# Patient Record
Sex: Female | Born: 1980 | Race: White | Hispanic: No | Marital: Single | Smoking: Current every day smoker
Health system: Southern US, Community
[De-identification: ages and names within clinical notes are randomized; demographics above are authoritative.]

## PROBLEM LIST (undated history)

## (undated) DIAGNOSIS — F32A Depression, unspecified: Secondary | ICD-10-CM

## (undated) DIAGNOSIS — Z8619 Personal history of other infectious and parasitic diseases: Secondary | ICD-10-CM

## (undated) DIAGNOSIS — F329 Major depressive disorder, single episode, unspecified: Secondary | ICD-10-CM

## (undated) HISTORY — DX: Depression, unspecified: F32.A

## (undated) HISTORY — DX: Personal history of other infectious and parasitic diseases: Z86.19

## (undated) HISTORY — DX: Major depressive disorder, single episode, unspecified: F32.9

---
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
## 1999-02-20 ENCOUNTER — Other Ambulatory Visit: Admission: RE | Admit: 1999-02-20 | Discharge: 1999-02-20 | Payer: Self-pay | Admitting: Gynecology

## 1999-06-06 ENCOUNTER — Emergency Department (HOSPITAL_COMMUNITY): Admission: EM | Admit: 1999-06-06 | Discharge: 1999-06-06 | Payer: Self-pay | Admitting: Emergency Medicine

## 1999-10-02 ENCOUNTER — Other Ambulatory Visit: Admission: RE | Admit: 1999-10-02 | Discharge: 1999-10-02 | Payer: Self-pay | Admitting: Gynecology

## 2000-09-23 ENCOUNTER — Other Ambulatory Visit: Admission: RE | Admit: 2000-09-23 | Discharge: 2000-09-23 | Payer: Self-pay | Admitting: Gynecology

## 2001-06-24 ENCOUNTER — Encounter: Admission: RE | Admit: 2001-06-24 | Discharge: 2001-06-24 | Payer: Self-pay

## 2002-03-27 ENCOUNTER — Other Ambulatory Visit: Admission: RE | Admit: 2002-03-27 | Discharge: 2002-03-27 | Payer: Self-pay | Admitting: Family Medicine

## 2002-04-18 ENCOUNTER — Emergency Department (HOSPITAL_COMMUNITY): Admission: EM | Admit: 2002-04-18 | Discharge: 2002-04-18 | Payer: Self-pay | Admitting: Emergency Medicine

## 2002-04-18 ENCOUNTER — Encounter: Payer: Self-pay | Admitting: Emergency Medicine

## 2004-12-18 ENCOUNTER — Emergency Department (HOSPITAL_COMMUNITY): Admission: EM | Admit: 2004-12-18 | Discharge: 2004-12-18 | Payer: Self-pay | Admitting: Emergency Medicine

## 2004-12-22 ENCOUNTER — Encounter: Admission: RE | Admit: 2004-12-22 | Discharge: 2004-12-22 | Payer: Self-pay | Admitting: General Surgery

## 2005-11-22 IMAGING — RF DG UGI W/ SMALL BOWEL HIGH DENSITY
19 of 24 series · 19 of 24 positions shown · non-contrast
Comparison: none

CLINICAL DATA: Abdominal pain, question intussusception on outside CT of abdomen and pelvis.
 UPPER GI SERIES:
 Prior to performing the upper GI and small bowel follow through, the CT from [HOSPITAL] dated 12/18/04 was reviewed.  This questions a possible intussusception in the left upper quadrant involving small bowel but no distention of more proximal small bowel is seen.
 On the present double contrast upper GI, the mucosa of the esophagus appears normal. Peristalsis is normal.  No hiatal hernia or reflux is seen.  The stomach is normal in peristalsis, and the duodenal bulb is in normal position.  The duodenal loop appears normal although the duodenal loop is somewhat elongated.  
 SMALL BOWEL FOLLOW THROUGH:
 The patient was given additional barium orally and additional images of the small bowel were obtained.  With the question being particularly in the left upper quadrant, i.e., proximal jejunum, oblique films and spot films over that region were obtained.  Bowel appears normal with no evidence of edema or intussusception. The remainder of the small bowel also has normal mucosal pattern with no edema, mass, or dilatation.  The terminal ileum is well seen and appears normal.

[Series 1: run · 1 of 1 slices shown (1 of 19)]
[im 1/1]
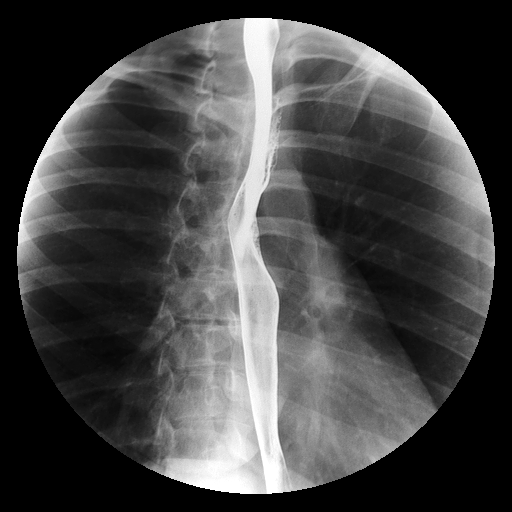

[Series 3: run · 1 of 1 slices shown (2 of 19)]
[im 1/1]
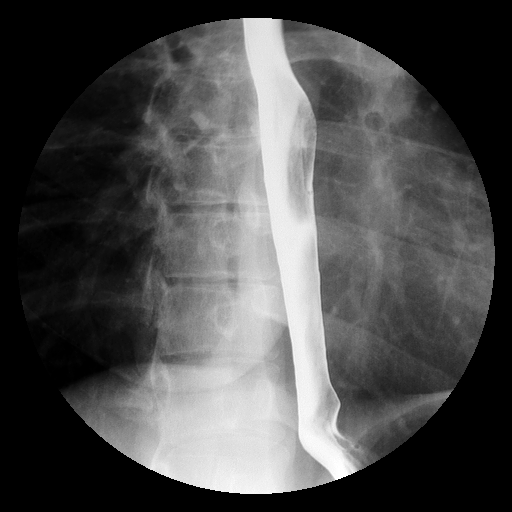

[Series 6: run · 1 of 1 slices shown (3 of 19)]
[im 1/1]
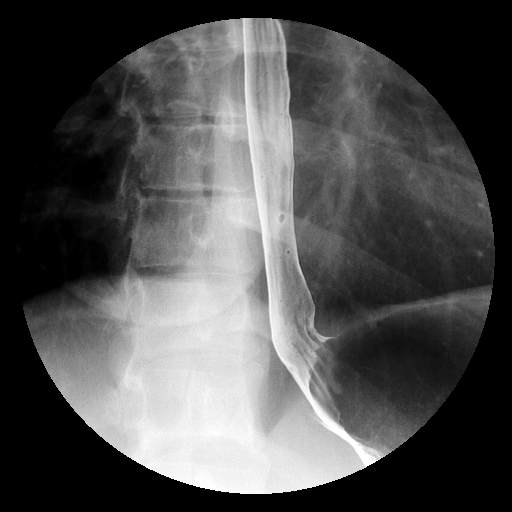

[Series 7: run · 1 of 1 slices shown (4 of 19)]
[im 1/1]
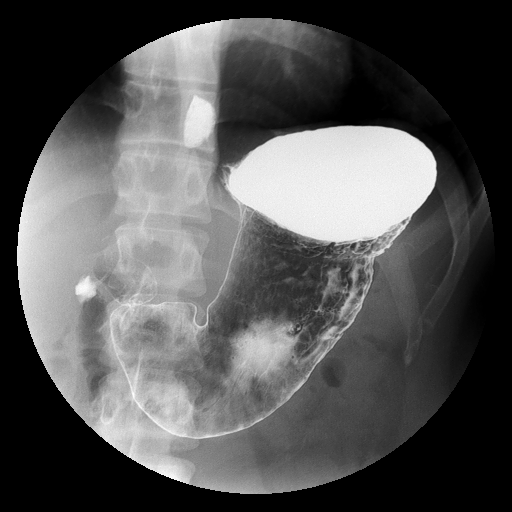

[Series 8: run · 1 of 1 slices shown (5 of 19)]
[im 1/1]
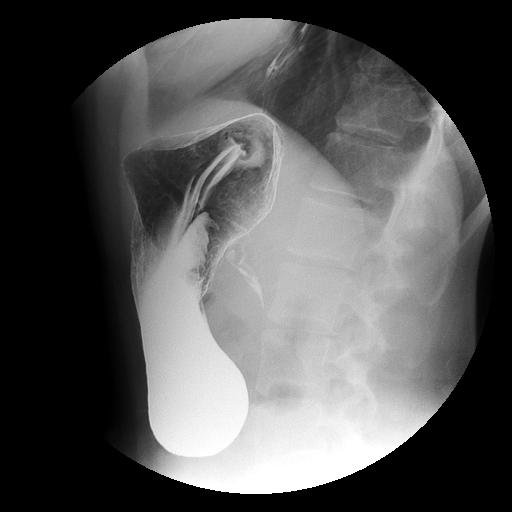

[Series 9: run · 1 of 1 slices shown (6 of 19)]
[im 1/1]
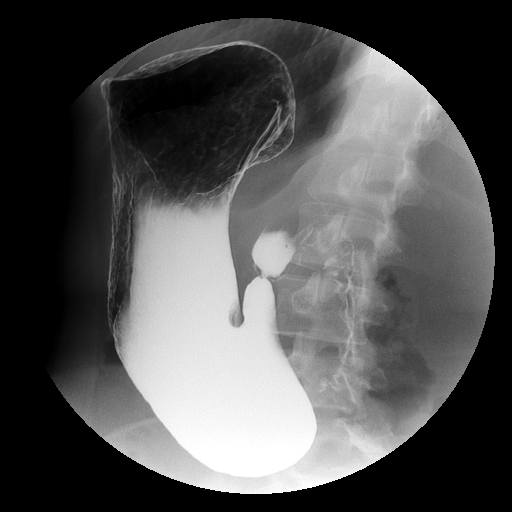

[Series 11: run · 1 of 1 slices shown (7 of 19)]
[im 1/1]
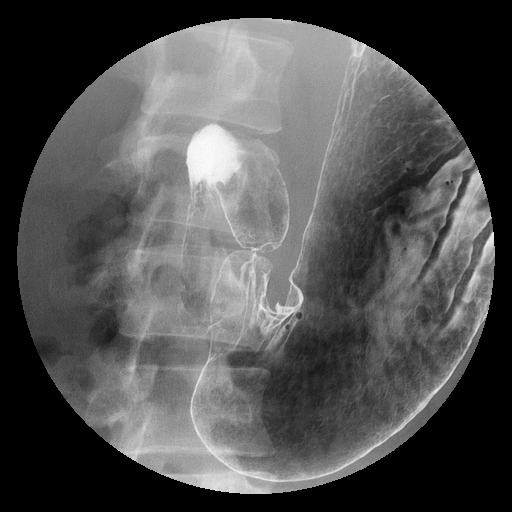

[Series 12: run · 1 of 1 slices shown (8 of 19)]
[im 1/1]
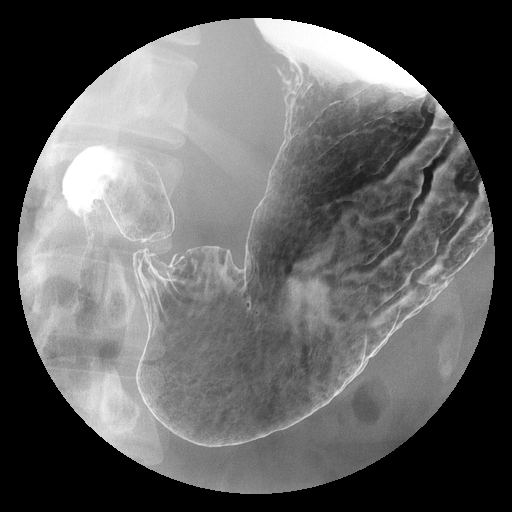

[Series 13: run · 1 of 1 slices shown (9 of 19)]
[im 1/1]
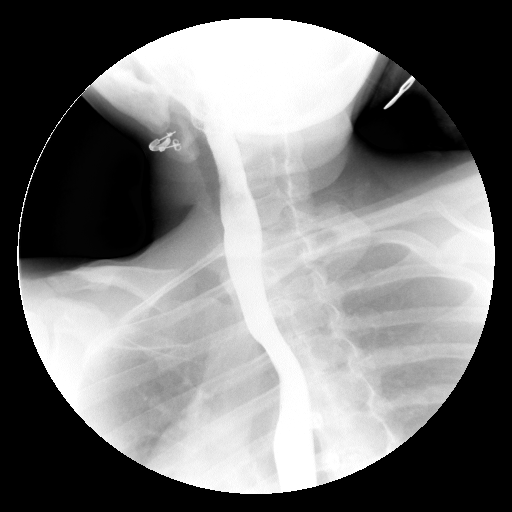

[Series 15: run · 1 of 1 slices shown (10 of 19)]
[im 1/1]
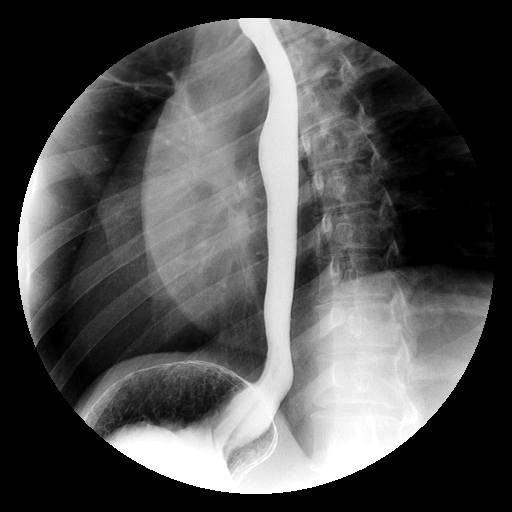

[Series 16: run · 1 of 1 slices shown (11 of 19)]
[im 1/1]
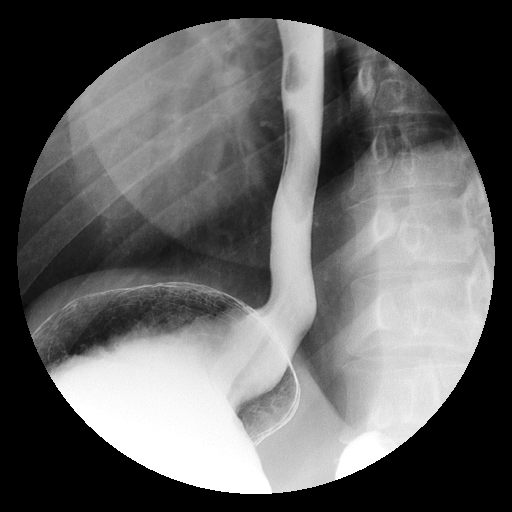

[Series 17: run · 1 of 1 slices shown (12 of 19)]
[im 1/1]
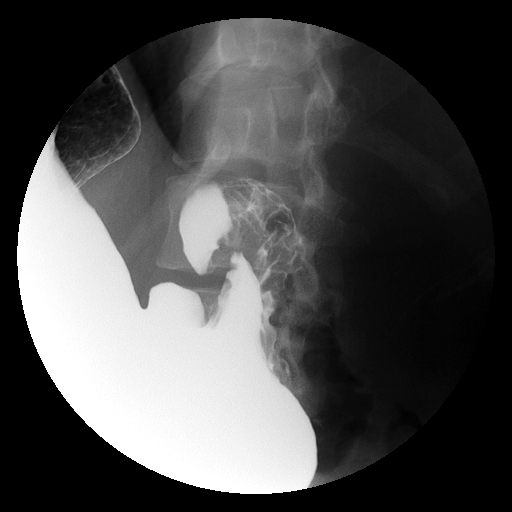

[Series 18: run · 1 of 1 slices shown (13 of 19)]
[im 1/1]
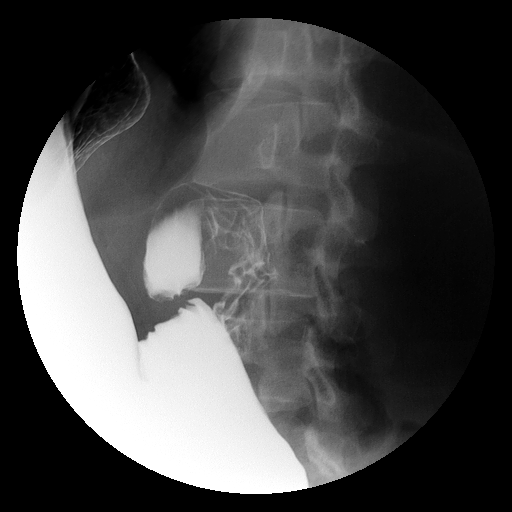

[Series 20: run · 1 of 1 slices shown (14 of 19)]
[im 1/1]
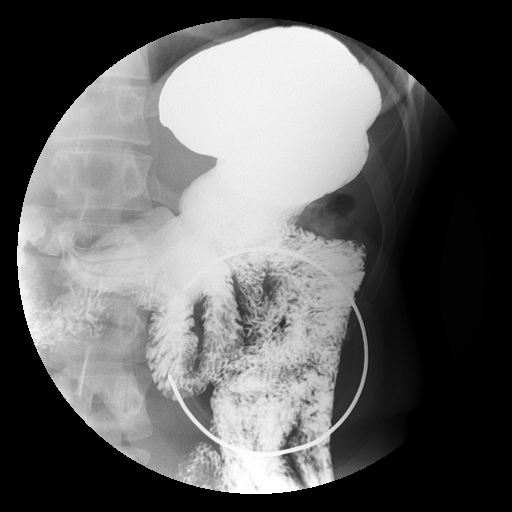

[Series 21: run · 1 of 1 slices shown (15 of 19)]
[im 1/1]
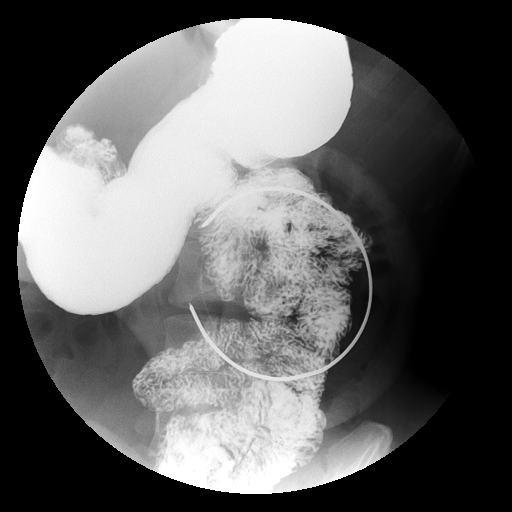

[Series 22: run · 1 of 1 slices shown (16 of 19)]
[im 1/1]
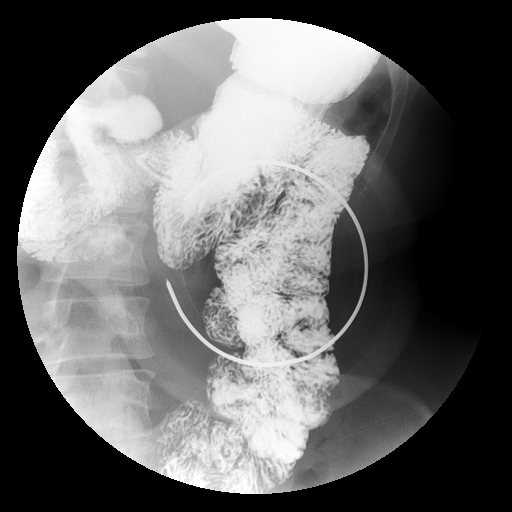

[Series 23: run · 1 of 1 slices shown (17 of 19)]
[im 1/1]
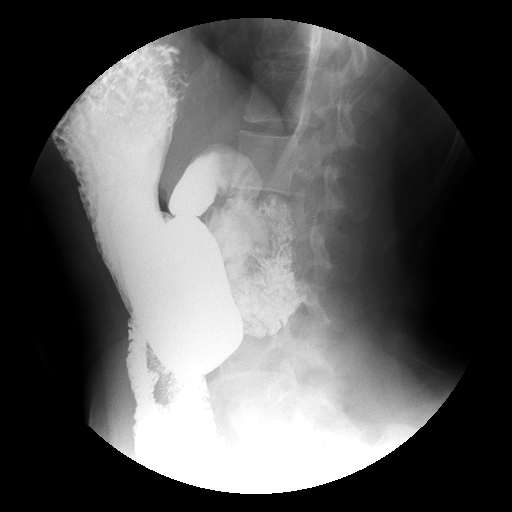

[Series 25: run · 1 of 1 slices shown (18 of 19)]
[im 1/1]
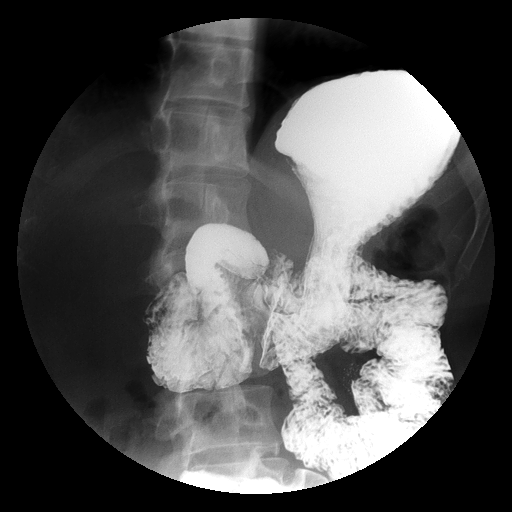

[Series 26: run · 1 of 1 slices shown (19 of 19)]
[im 1/1]
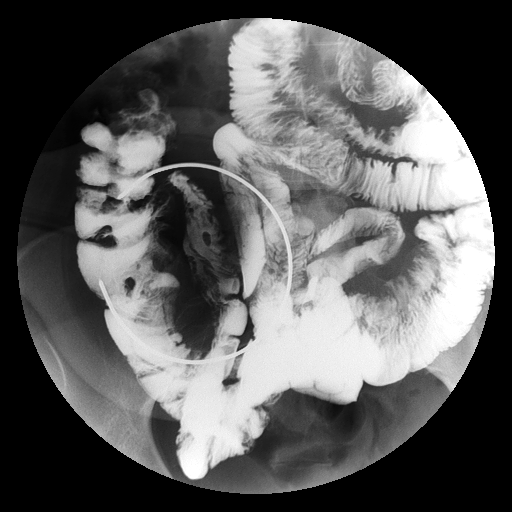

[19 of 24 positions shown; findings below may reference images not displayed]

IMPRESSION: 1.  Negative upper GI.
 2.  Negative small bowel follow through.

## 2006-01-31 ENCOUNTER — Other Ambulatory Visit: Admission: RE | Admit: 2006-01-31 | Discharge: 2006-01-31 | Payer: Self-pay | Admitting: Gynecology

## 2006-02-24 ENCOUNTER — Emergency Department (HOSPITAL_COMMUNITY): Admission: EM | Admit: 2006-02-24 | Discharge: 2006-02-24 | Payer: Self-pay | Admitting: Emergency Medicine

## 2006-06-04 HISTORY — PX: TONSILLECTOMY: SHX5217

## 2006-10-31 ENCOUNTER — Emergency Department (HOSPITAL_COMMUNITY): Admission: EM | Admit: 2006-10-31 | Discharge: 2006-10-31 | Payer: Self-pay | Admitting: Emergency Medicine

## 2006-11-04 ENCOUNTER — Emergency Department (HOSPITAL_COMMUNITY): Admission: EM | Admit: 2006-11-04 | Discharge: 2006-11-04 | Payer: Self-pay | Admitting: Emergency Medicine

## 2007-04-18 ENCOUNTER — Encounter (INDEPENDENT_AMBULATORY_CARE_PROVIDER_SITE_OTHER): Payer: Self-pay | Admitting: Otolaryngology

## 2007-04-18 ENCOUNTER — Ambulatory Visit (HOSPITAL_BASED_OUTPATIENT_CLINIC_OR_DEPARTMENT_OTHER): Admission: RE | Admit: 2007-04-18 | Discharge: 2007-04-18 | Payer: Self-pay | Admitting: Otolaryngology

## 2007-10-01 IMAGING — US US ABDOMEN COMPLETE
1 series · 14 of 25 positions shown · non-contrast
Comparison: None.

CLINICAL DATA: Abdominal pain. 
 ABDOMEN ULTRASOUND COMPLETE ? 10/31/06:
TECHNIQUE: Complete abdominal ultrasound examination was performed including evaluation of the liver, gallbladder, bile ducts, pancreas, kidneys, spleen, IVC, and abdominal aorta.

[Series 1: unknown · 0.25mm/px · 14 of 53 slices shown]
[im 1/53]
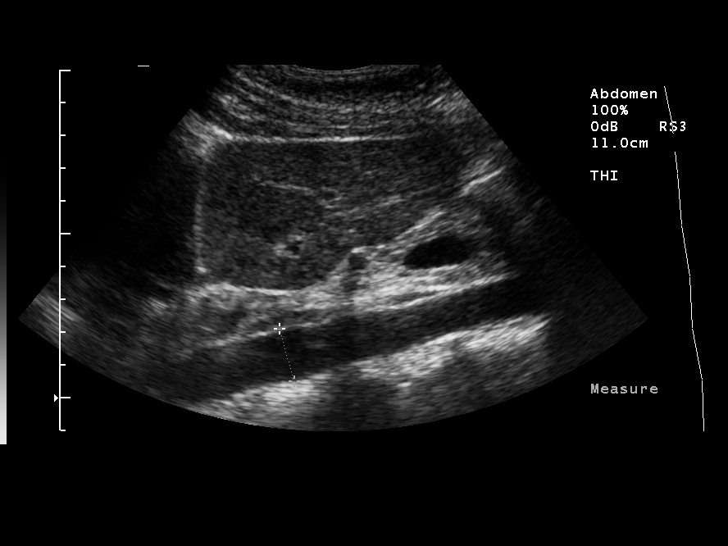
[im 5/53]
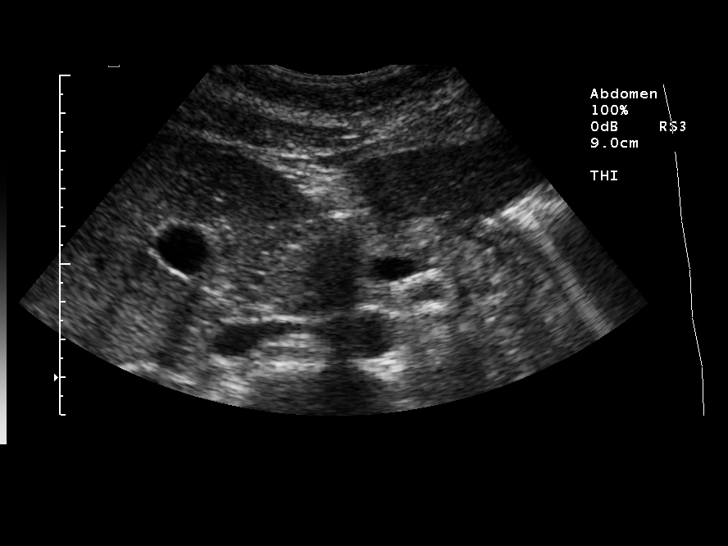
[im 9/53]
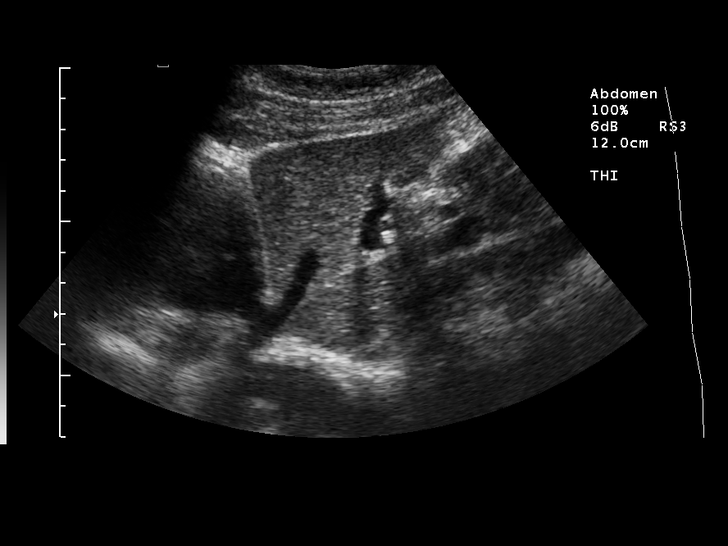
[im 14/53]
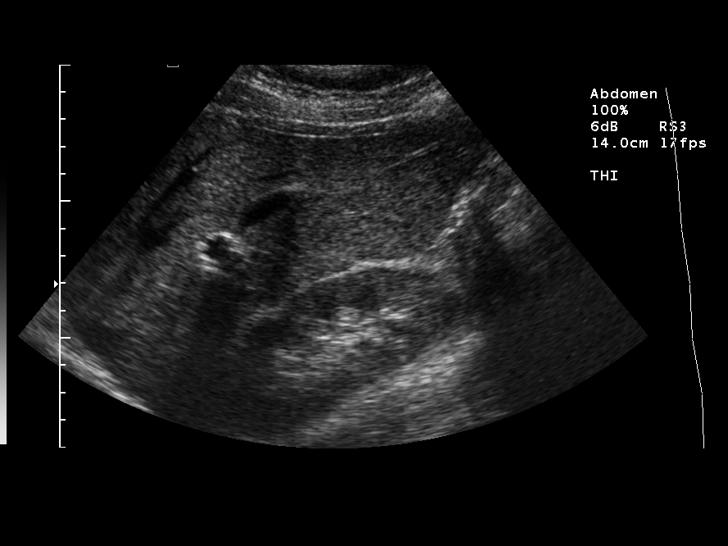
[im 18/53]
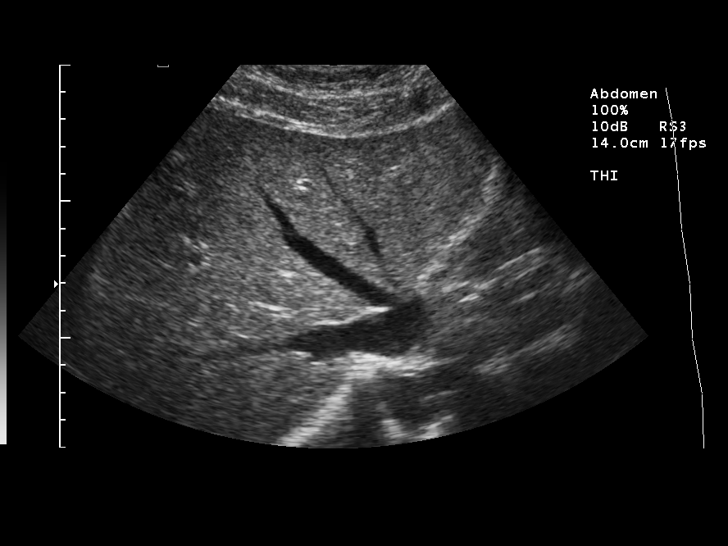
[im 20/53]
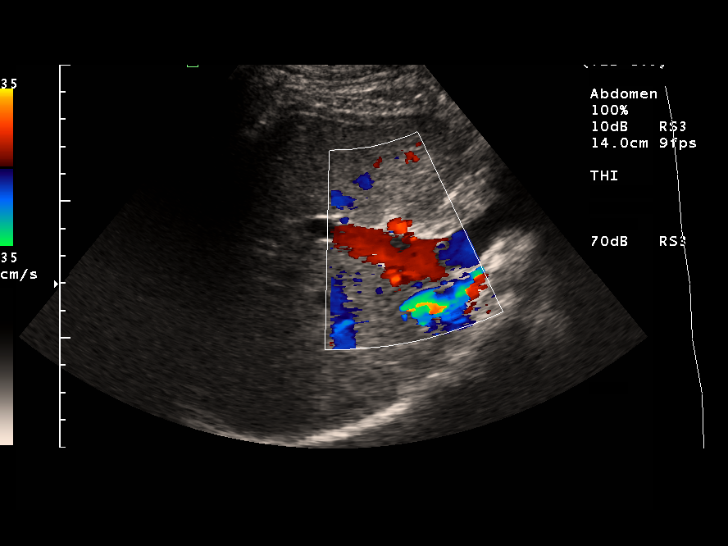
[im 24/53]
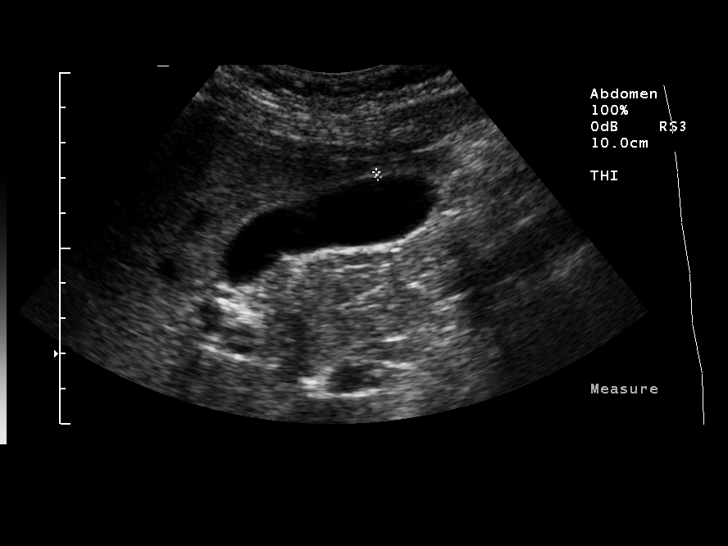
[im 29/53]
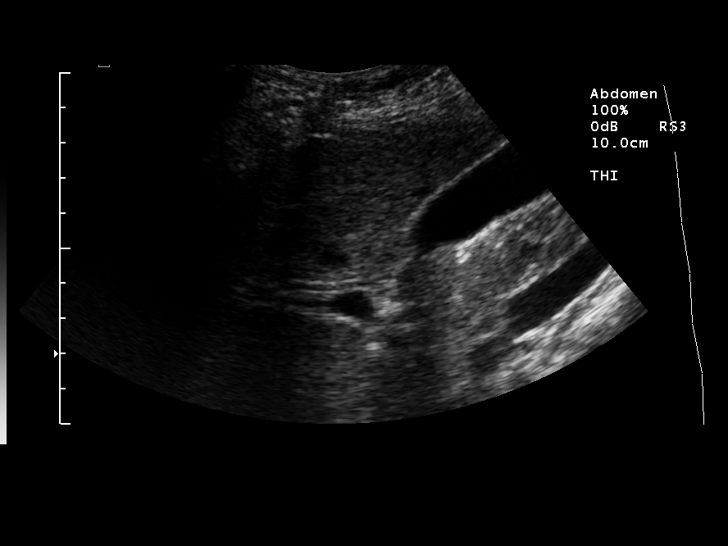
[im 33/53]
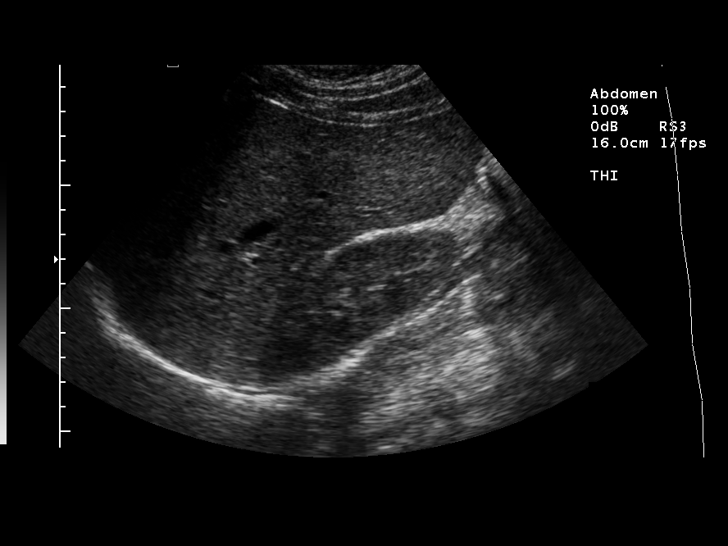
[im 35/53]
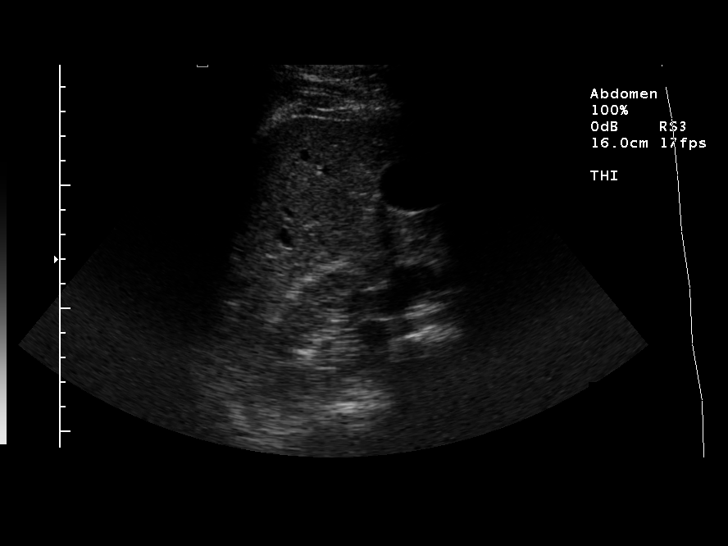
[im 40/53]
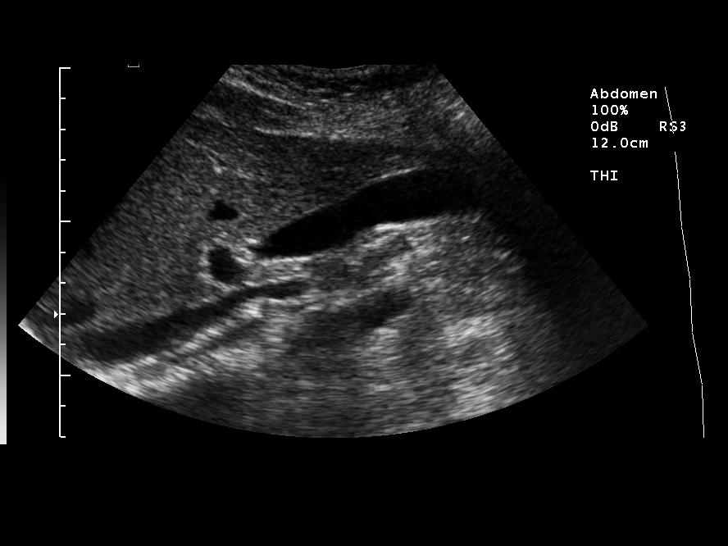
[im 44/53]
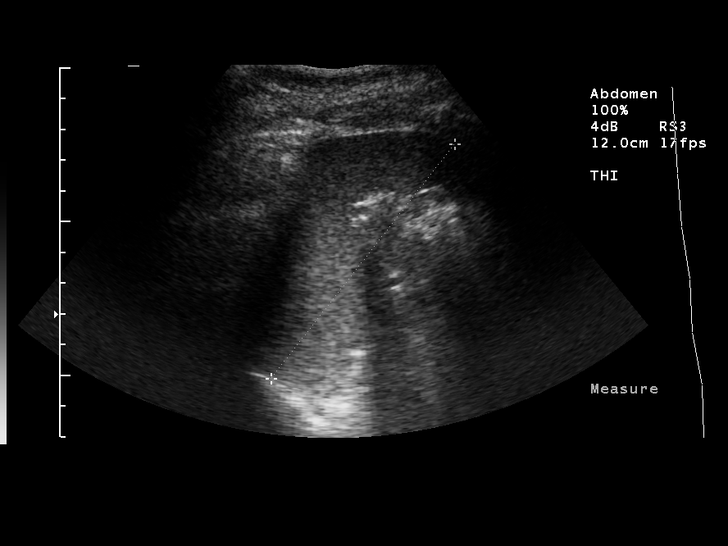
[im 48/53]
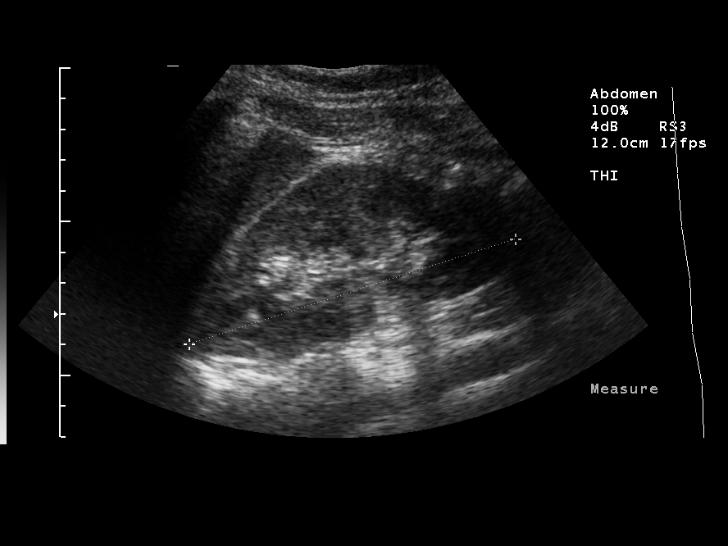
[im 53/53]
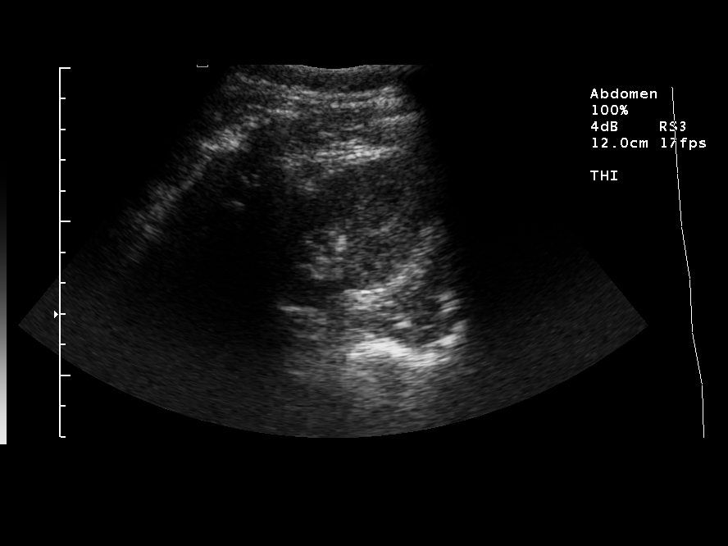

[14 of 25 positions shown; findings below may reference images not displayed]

FINDINGS: The gallbladder and biliary tree are normal.  The common duct is 2.8 mm.  The liver, spleen, and pancreas are normal.  The kidneys, aorta, and IVC are normal.  No ascites.
IMPRESSION: Normal exam.

## 2010-06-25 ENCOUNTER — Encounter: Payer: Self-pay | Admitting: Family Medicine

## 2010-06-25 ENCOUNTER — Encounter: Payer: Self-pay | Admitting: Obstetrics & Gynecology

## 2010-10-17 NOTE — Op Note (Signed)
[name], [name]               ACCOUNT NO.:  [id number]   MEDICAL RECORD NO.:  [id number]          PATIENT TYPE:  AMB   LOCATION:  DSC                          FACILITY:  [facility]   PHYSICIAN:  Christopher E. Ezzard Standing, M.D.DATE OF BIRTH:  03-Nov-1980   DATE OF PROCEDURE:  04/18/2007  DATE OF DISCHARGE:                               OPERATIVE REPORT   PREOPERATIVE DIAGNOSES:  1. Recurrent tonsillitis.  2. Chronic cryptic tonsillitis.   POSTOPERATIVE DIAGNOSES:  1. Recurrent tonsillitis.  2. Chronic cryptic tonsillitis.   OPERATION:  Tonsillectomy.   SURGEON:  Narda Bonds, MD   ANESTHESIA:  General endotracheal.   COMPLICATIONS:  None.   CLINICAL NOTE:  Cheryl Soto is a 30 year old female who has had  chronic cryptic tonsils with white debris in the tonsil crypts as well  as recurrent sore throats.  She is taken to operating room at this time  for tonsillectomy.   DESCRIPTION OF PROCEDURE:  After adequate endotracheal anesthesia,  Deseray received 1 g of Ancef IV preoperatively as well as 10 mg of  Decadron IV preoperatively.  A mouth gag was used to expose the  oropharynx.  The left and right tonsils were then separated from the  tonsillar fossae using cautery.  Care was taken to preserve the anterior  and posterior tonsillar pillars as well as the uvula.  Hemostasis was  obtained with the cautery.  After obtaining adequate hemostasis the  oropharynx was irrigated with saline.  Janellie was awoken from anesthesia  and transferred to the recovery room postop doing well.   DISPOSITION:  [name] was discharged home later this morning on the  Zithromax suspension for 5 days, Tylenol and Lortab elixir 1 to 1-1/2  tablespoons q.4 h. p.r.n. pain.  I will have her follow up in my office  in 2 weeks for recheck.Narda Bonds.  Office.           ______________________________  Kristine Garbe Ezzard Standing, M.D.     CEN/MEDQ  D:  04/18/2007  T:  04/19/2007  Job:  [id number]

## 2010-10-20 NOTE — Consult Note (Signed)
NAMEDIONA, PEREGOY               ACCOUNT NO.:  [id number]   MEDICAL RECORD NO.:  [id number]          PATIENT TYPE:  EMS   LOCATION:  ED                           FACILITY:  [facility]   PHYSICIAN:  Adolph Pollack, M.D.DATE OF BIRTH:  Aug 14, 1980   DATE OF CONSULTATION:  12/18/2004  DATE OF DISCHARGE:                                   CONSULTATION   REFERRING PHYSICIAN:  Kinnie Scales. Reed Breech, M.D.   REASON FOR CONSULTATION:  Epigastric pain, possible intussusception.   HISTORY OF PRESENT ILLNESS:  Cheryl Soto is a 30 year old female who awoke  early this morning with acute, sharp, epigastric pain.  It was fairly  constant at the time. She had one episode of nausea and vomiting.  She tried  to go to work and take a little bit of an antacid which gave her just a  little relief, but continued to have the pain and presented [facility].  While there, she had a CBC drawn which was normal as was a UA and  urine pregnancy test which were normal.  She was sent for a CT scan at  [facility] and this suggested a possible jejunal  intussusception.  I subsequently was asked to see her and requested that she  come to the [facility] Emergency Department.  Upon arrival here, she feels  much better.  She does not have the severe pain any longer.  She has no  nausea and she took her contrast well.  She denies any fevers, chills,  diarrhea or blood in her stool.   PAST MEDICAL HISTORY:  Chronic constipation.   PAST SURGICAL HISTORY:  None.   ALLERGIES:  PENICILLIN.   MEDICATIONS:  None.   SOCIAL HISTORY:  Single.  Works at [employer]  She smokes cigarettes.  Rare to  occasional alcohol use.   FAMILY HISTORY:  No history of intussusception in the family.   REVIEW OF SYSTEMS:  CARDIOVASCULAR:  No known heart disease or hypertension.  PULMONARY:  No asthma.  No tuberculosis.  GASTROINTESTINAL:  She has an  occasional reflux.  No peptic ulcer disease.  No hepatitis.  There was some  question of diverticulosis in the past.  Does have chronic constipation.  This apparently has been a problem that has occurred since high school, per  her mother.  GENITOURINARY:  No dysuria, no hematuria.  ENDOCRINE:  No  diabetes or thyroid disease.  NEUROLOGIC:  No seizures.  HEMATOLOGIC:  No  known bleeding disorders or blood clots.   PHYSICAL EXAMINATION:  GENERAL:  Well-developed, well-nourished female in no  acute distress, sitting up in the bed and smiling.  VITAL SIGNS:  Temperature 98.2, blood pressure 121/79, pulse 79,  respirations 18, O2 saturations 99% on room air.  SKIN:  Warm and dry, no jaundice.  HEENT:  Extraocular movements intact.  No icterus.  NECK:  Supple without masses or obvious thyroid enlargement.  LUNGS:  Clear to auscultation bilaterally.  CARDIAC:  Regular rate and rhythm.  No murmur heard.  EXTREMITIES:  No lower extremity edema.  No JVD.  ABDOMEN:  Soft and flat, nondistended.  No palpable masses or organomegaly.  No hernias.  Active bowel sounds.  Very minimal epigastric tenderness to  palpation.  NEUROLOGIC:  Good muscle tone.  Full range of motion.   LABORATORY DATA AND X-RAY FINDINGS:  White cell count 7700, hemoglobin 14.2.  UA normal.  Urine pregnancy test is negative.   CT scan was reviewed with a radiologist here and does demonstrate what  appears to be a bull's eye type sign, possibly in the jejunum.  However,  there is no proximal obstruction.  There is no inflammatory change present.   IMPRESSION:  Acute epigastric pain that has now resolved.  Computed  tomography suggests intussusception, although if it was, it was transient as  she is asymptomatic at this time.  If it was, it could be secondary to an  intraluminal tumor, most likely benign lesion in her case.  I do not feel  she needs urgent or emergency surgery at this time.   PLAN:  Will discharge her to home on a liquid diet x3 days.  Will plan on  performing an upper GI with small  bowel follow through as an outpatient in  about 3 days.  If she is recurrent for symptoms, she can call.  Plan to see  her back in the office in a couple of weeks.       TJR/MEDQ  D:  12/18/2004  T:  12/18/2004  Job:  [id number]   cc:   Onalee Hua L. Reed Breech, M.D.  [facility]  [address]  [place], [place] [address]  Fax: [phone]

## 2011-03-22 LAB — RAPID URINE DRUG SCREEN, HOSP PERFORMED
Amphetamines: NOT DETECTED
Barbiturates: NOT DETECTED
Benzodiazepines: NOT DETECTED
Cocaine: NOT DETECTED
Opiates: NOT DETECTED
Tetrahydrocannabinol: POSITIVE — AB

## 2011-03-22 LAB — URINALYSIS, ROUTINE W REFLEX MICROSCOPIC
Bilirubin Urine: NEGATIVE
Glucose, UA: NEGATIVE
Hgb urine dipstick: NEGATIVE
Ketones, ur: NEGATIVE
Nitrite: NEGATIVE
Protein, ur: NEGATIVE
Specific Gravity, Urine: 1.014
Urobilinogen, UA: 0.2
pH: 7.5

## 2011-03-22 LAB — PREGNANCY, URINE: Preg Test, Ur: NEGATIVE

## 2012-06-28 ENCOUNTER — Ambulatory Visit: Payer: BC Managed Care – PPO | Admitting: Radiology

## 2013-05-20 ENCOUNTER — Ambulatory Visit: Payer: Self-pay | Admitting: Internal Medicine

## 2013-06-16 ENCOUNTER — Ambulatory Visit (INDEPENDENT_AMBULATORY_CARE_PROVIDER_SITE_OTHER): Payer: BC Managed Care – PPO | Admitting: Internal Medicine

## 2013-06-16 ENCOUNTER — Encounter: Payer: Self-pay | Admitting: Internal Medicine

## 2013-06-16 ENCOUNTER — Other Ambulatory Visit (INDEPENDENT_AMBULATORY_CARE_PROVIDER_SITE_OTHER): Payer: BC Managed Care – PPO

## 2013-06-16 VITALS — BP 112/70 | HR 104 | Temp 99.5°F | Ht 65.0 in | Wt 150.8 lb

## 2013-06-16 DIAGNOSIS — Z0000 Encounter for general adult medical examination without abnormal findings: Secondary | ICD-10-CM

## 2013-06-16 DIAGNOSIS — F32A Depression, unspecified: Secondary | ICD-10-CM | POA: Insufficient documentation

## 2013-06-16 DIAGNOSIS — F17200 Nicotine dependence, unspecified, uncomplicated: Secondary | ICD-10-CM | POA: Insufficient documentation

## 2013-06-16 DIAGNOSIS — F329 Major depressive disorder, single episode, unspecified: Secondary | ICD-10-CM

## 2013-06-16 DIAGNOSIS — F3289 Other specified depressive episodes: Secondary | ICD-10-CM

## 2013-06-16 LAB — CBC WITH DIFFERENTIAL/PLATELET
BASOS PCT: 0.8 % (ref 0.0–3.0)
Basophils Absolute: 0.1 10*3/uL (ref 0.0–0.1)
EOS PCT: 1 % (ref 0.0–5.0)
Eosinophils Absolute: 0.1 10*3/uL (ref 0.0–0.7)
HEMATOCRIT: 42.5 % (ref 36.0–46.0)
HEMOGLOBIN: 14.5 g/dL (ref 12.0–15.0)
LYMPHS ABS: 1.9 10*3/uL (ref 0.7–4.0)
Lymphocytes Relative: 25.9 % (ref 12.0–46.0)
MCHC: 34.1 g/dL (ref 30.0–36.0)
MCV: 92.9 fl (ref 78.0–100.0)
MONOS PCT: 9 % (ref 3.0–12.0)
Monocytes Absolute: 0.7 10*3/uL (ref 0.1–1.0)
Neutro Abs: 4.7 10*3/uL (ref 1.4–7.7)
Neutrophils Relative %: 63.3 % (ref 43.0–77.0)
PLATELETS: 255 10*3/uL (ref 150.0–400.0)
RBC: 4.57 Mil/uL (ref 3.87–5.11)
RDW: 13.3 % (ref 11.5–14.6)
WBC: 7.4 10*3/uL (ref 4.5–10.5)

## 2013-06-16 LAB — URINALYSIS, ROUTINE W REFLEX MICROSCOPIC
BILIRUBIN URINE: NEGATIVE
Hgb urine dipstick: NEGATIVE
KETONES UR: NEGATIVE
Leukocytes, UA: NEGATIVE
Nitrite: NEGATIVE
PH: 7 (ref 5.0–8.0)
SPECIFIC GRAVITY, URINE: 1.015 (ref 1.000–1.030)
Total Protein, Urine: NEGATIVE
URINE GLUCOSE: NEGATIVE
UROBILINOGEN UA: 0.2 (ref 0.0–1.0)

## 2013-06-16 LAB — BASIC METABOLIC PANEL
BUN: 12 mg/dL (ref 6–23)
CALCIUM: 9.8 mg/dL (ref 8.4–10.5)
CO2: 23 meq/L (ref 19–32)
Chloride: 107 mEq/L (ref 96–112)
Creatinine, Ser: 0.7 mg/dL (ref 0.4–1.2)
GFR: 97.19 mL/min (ref >60.00)
GLUCOSE: 86 mg/dL (ref 70–99)
Potassium: 4.7 mEq/L (ref 3.5–5.1)
SODIUM: 138 meq/L (ref 135–145)

## 2013-06-16 LAB — HEPATIC FUNCTION PANEL
ALBUMIN: 4.6 g/dL (ref 3.5–5.2)
ALT: 20 U/L (ref 0–35)
AST: 20 U/L (ref 0–37)
Alkaline Phosphatase: 38 U/L — ABNORMAL LOW (ref 39–117)
BILIRUBIN TOTAL: 0.5 mg/dL (ref 0.3–1.2)
Bilirubin, Direct: 0 mg/dL (ref 0.0–0.3)
Total Protein: 7.7 g/dL (ref 6.0–8.3)

## 2013-06-16 LAB — LIPID PANEL
CHOL/HDL RATIO: 3
CHOLESTEROL: 204 mg/dL — AB (ref 0–200)
HDL: 78.3 mg/dL (ref >39.00)
TRIGLYCERIDES: 137 mg/dL (ref 0.0–149.0)
VLDL: 27.4 mg/dL (ref 0.0–40.0)

## 2013-06-16 LAB — LDL CHOLESTEROL, DIRECT: LDL DIRECT: 63.5 mg/dL

## 2013-06-16 LAB — TSH: TSH: 1.17 u[IU]/mL (ref 0.35–5.50)

## 2013-06-16 MED ORDER — PAROXETINE HCL 10 MG PO TABS: 10.0000 mg | ORAL_TABLET | Freq: Every day | ORAL | Status: DC

## 2013-06-16 NOTE — Patient Instructions (Addendum)
It was good to see you today.  We have reviewed your prior records including labs and tests today  Health Maintenance reviewed - all recommended immunizations and age-appropriate screenings are up-to-date.  Test(s) ordered today. Your results will be released to [place] (or called to you) after review, usually within 72hours after test completion. If any changes need to be made, you will be notified at that same time.  Medications reviewed and updated  Start low-dose generic Paxil once daily for mood and depression symptoms - Your prescription(s) have been submitted to your pharmacy. Please take as directed and contact our office if you believe you are having problem(s) with the medication(s).  Please schedule followup in  4-6 weeks for Mood and medication recheck/titration, call sooner if problems.  Health Maintenance, Female A healthy lifestyle and preventative care can promote health and wellness.  Maintain regular health, dental, and eye exams.  Eat a healthy diet. Foods like vegetables, fruits, whole grains, low-fat dairy products, and lean protein foods contain the nutrients you need without too many calories. Decrease your intake of foods high in solid fats, added sugars, and salt. Get information about a proper diet from your caregiver, if necessary.  Regular physical exercise is one of the most important things you can do for your health. Most adults should get at least 150 minutes of moderate-intensity exercise (any activity that increases your heart rate and causes you to sweat) each week. In addition, most adults need muscle-strengthening exercises on 2 or more days a week.   Maintain a healthy weight. The body mass index (BMI) is a screening tool to identify possible weight problems. It provides an estimate of body fat based on height and weight. Your caregiver can help determine your BMI, and can help you achieve or maintain a healthy weight. For adults 20 years and older:  A  BMI below 18.5 is considered underweight.  A BMI of 18.5 to 24.9 is normal.  A BMI of 25 to 29.9 is considered overweight.  A BMI of 30 and above is considered obese.  Maintain normal blood lipids and cholesterol by exercising and minimizing your intake of saturated fat. Eat a balanced diet with plenty of fruits and vegetables. Blood tests for lipids and cholesterol should begin at age 14 and be repeated every 5 years. If your lipid or cholesterol levels are high, you are over 50, or you are a high risk for heart disease, you may need your cholesterol levels checked more frequently.Ongoing high lipid and cholesterol levels should be treated with medicines if diet and exercise are not effective.  If you smoke, find out from your caregiver how to quit. If you do not use tobacco, do not start.  Lung cancer screening is recommended for adults aged 57 80 years who are at high risk for developing lung cancer because of a history of smoking. Yearly low-dose computed tomography (CT) is recommended for people who have at least a 30-pack-year history of smoking and are a current smoker or have quit within the past 15 years. A pack year of smoking is smoking an average of 1 pack of cigarettes a day for 1 year (for example: 1 pack a day for 30 years or 2 packs a day for 15 years). Yearly screening should continue until the smoker has stopped smoking for at least 15 years. Yearly screening should also be stopped for people who develop a health problem that would prevent them from having lung cancer treatment.  If you are  pregnant, do not drink alcohol. If you are breastfeeding, be very cautious about drinking alcohol. If you are not pregnant and choose to drink alcohol, do not exceed 1 drink per day. One drink is considered to be 12 ounces (355 mL) of beer, 5 ounces (148 mL) of wine, or 1.5 ounces (44 mL) of liquor.  Avoid use of street drugs. Do not share needles with anyone. Ask for help if you need support or  instructions about stopping the use of drugs.  High blood pressure causes heart disease and increases the risk of stroke. Blood pressure should be checked at least every 1 to 2 years. Ongoing high blood pressure should be treated with medicines, if weight loss and exercise are not effective.  If you are 62 to 34 years old, ask your caregiver if you should take aspirin to prevent strokes.  Diabetes screening involves taking a blood sample to check your fasting blood sugar level. This should be done once every 3 years, after age 11, if you are within normal weight and without risk factors for diabetes. Testing should be considered at a younger age or be carried out more frequently if you are overweight and have at least 1 risk factor for diabetes.  Breast cancer screening is essential preventative care for women. You should practice "breast self-awareness." This means understanding the normal appearance and feel of your breasts and may include breast self-examination. Any changes detected, no matter how small, should be reported to a caregiver. Women in their 68s and 30s should have a clinical breast exam (CBE) by a caregiver as part of a regular health exam every 1 to 3 years. After age 83, women should have a CBE every year. Starting at age 77, women should consider having a mammogram (breast X-ray) every year. Women who have a family history of breast cancer should talk to their caregiver about genetic screening. Women at a high risk of breast cancer should talk to their caregiver about having an MRI and a mammogram every year.  Breast cancer gene (BRCA)-related cancer risk assessment is recommended for women who have family members with BRCA-related cancers. BRCA-related cancers include breast, ovarian, tubal, and peritoneal cancers. Having family members with these cancers may be associated with an increased risk for harmful changes (mutations) in the breast cancer genes BRCA1 and BRCA2. Results of the  assessment will determine the need for genetic counseling and BRCA1 and BRCA2 testing.  The Pap test is a screening test for cervical cancer. Women should have a Pap test starting at age 29. Between ages 15 and 41, Pap tests should be repeated every 2 years. Beginning at age 75, you should have a Pap test every 3 years as long as the past 3 Pap tests have been normal. If you had a hysterectomy for a problem that was not cancer or a condition that could lead to cancer, then you no longer need Pap tests. If you are between ages 54 and 69, and you have had normal Pap tests going back 10 years, you no longer need Pap tests. If you have had past treatment for cervical cancer or a condition that could lead to cancer, you need Pap tests and screening for cancer for at least 20 years after your treatment. If Pap tests have been discontinued, risk factors (such as a new sexual partner) need to be reassessed to determine if screening should be resumed. Some women have medical problems that increase the chance of getting cervical cancer. In these  cases, your caregiver may recommend more frequent screening and Pap tests.  The human papillomavirus (HPV) test is an additional test that may be used for cervical cancer screening. The HPV test looks for the virus that can cause the cell changes on the cervix. The cells collected during the Pap test can be tested for HPV. The HPV test could be used to screen women aged 33 years and older, and should be used in women of any age who have unclear Pap test results. After the age of 94, women should have HPV testing at the same frequency as a Pap test.  Colorectal cancer can be detected and often prevented. Most routine colorectal cancer screening begins at the age of 28 and continues through age 68. However, your caregiver may recommend screening at an earlier age if you have risk factors for colon cancer. On a yearly basis, your caregiver may provide home test kits to check for  hidden blood in the stool. Use of a small camera at the end of a tube, to directly examine the colon (sigmoidoscopy or colonoscopy), can detect the earliest forms of colorectal cancer. Talk to your caregiver about this at age 83, when routine screening begins. Direct examination of the colon should be repeated every 5 to 10 years through age 50, unless early forms of pre-cancerous polyps or small growths are found.  Hepatitis C blood testing is recommended for all people born from [birth year] through 1966 and any individual with known risks for hepatitis C.  Practice safe sex. Use condoms and avoid high-risk sexual practices to reduce the spread of sexually transmitted infections (STIs). Sexually active women aged 11 and younger should be checked for Chlamydia, which is a common sexually transmitted infection. Older women with new or multiple partners should also be tested for Chlamydia. Testing for other STIs is recommended if you are sexually active and at increased risk.  Osteoporosis is a disease in which the bones lose minerals and strength with aging. This can result in serious bone fractures. The risk of osteoporosis can be identified using a bone density scan. Women ages 8 and over and women at risk for fractures or osteoporosis should discuss screening with their caregivers. Ask your caregiver whether you should be taking a calcium supplement or vitamin D to reduce the rate of osteoporosis.  Menopause can be associated with physical symptoms and risks. Hormone replacement therapy is available to decrease symptoms and risks. You should talk to your caregiver about whether hormone replacement therapy is right for you.  Use sunscreen. Apply sunscreen liberally and repeatedly throughout the day. You should seek shade when your shadow is shorter than you. Protect yourself by wearing long sleeves, pants, a wide-brimmed hat, and sunglasses year round, whenever you are outdoors.  Notify your caregiver of new  moles or changes in moles, especially if there is a change in shape or color. Also notify your caregiver if a mole is larger than the size of a pencil eraser.  Stay current with your immunizations. Document Released: 12/04/2010 Document Revised: 09/15/2012 Document Reviewed: 12/04/2010 [facility] Patient Information 2014 [place]. Health Maintenance, Female A healthy lifestyle and preventative care can promote health and wellness.  Maintain regular health, dental, and eye exams.  Eat a healthy diet. Foods like vegetables, fruits, whole grains, low-fat dairy products, and lean protein foods contain the nutrients you need without too many calories. Decrease your intake of foods high in solid fats, added sugars, and salt. Get information about a proper diet  from your caregiver, if necessary.  Regular physical exercise is one of the most important things you can do for your health. Most adults should get at least 150 minutes of moderate-intensity exercise (any activity that increases your heart rate and causes you to sweat) each week. In addition, most adults need muscle-strengthening exercises on 2 or more days a week.   Maintain a healthy weight. The body mass index (BMI) is a screening tool to identify possible weight problems. It provides an estimate of body fat based on height and weight. Your caregiver can help determine your BMI, and can help you achieve or maintain a healthy weight. For adults 20 years and older:  A BMI below 18.5 is considered underweight.  A BMI of 18.5 to 24.9 is normal.  A BMI of 25 to 29.9 is considered overweight.  A BMI of 30 and above is considered obese.  Maintain normal blood lipids and cholesterol by exercising and minimizing your intake of saturated fat. Eat a balanced diet with plenty of fruits and vegetables. Blood tests for lipids and cholesterol should begin at age 109 and be repeated every 5 years. If your lipid or cholesterol levels are high, you  are over 50, or you are a high risk for heart disease, you may need your cholesterol levels checked more frequently.Ongoing high lipid and cholesterol levels should be treated with medicines if diet and exercise are not effective.  If you smoke, find out from your caregiver how to quit. If you do not use tobacco, do not start.  Lung cancer screening is recommended for adults aged 72 80 years who are at high risk for developing lung cancer because of a history of smoking. Yearly low-dose computed tomography (CT) is recommended for people who have at least a 30-pack-year history of smoking and are a current smoker or have quit within the past 15 years. A pack year of smoking is smoking an average of 1 pack of cigarettes a day for 1 year (for example: 1 pack a day for 30 years or 2 packs a day for 15 years). Yearly screening should continue until the smoker has stopped smoking for at least 15 years. Yearly screening should also be stopped for people who develop a health problem that would prevent them from having lung cancer treatment.  If you are pregnant, do not drink alcohol. If you are breastfeeding, be very cautious about drinking alcohol. If you are not pregnant and choose to drink alcohol, do not exceed 1 drink per day. One drink is considered to be 12 ounces (355 mL) of beer, 5 ounces (148 mL) of wine, or 1.5 ounces (44 mL) of liquor.  Avoid use of street drugs. Do not share needles with anyone. Ask for help if you need support or instructions about stopping the use of drugs.  High blood pressure causes heart disease and increases the risk of stroke. Blood pressure should be checked at least every 1 to 2 years. Ongoing high blood pressure should be treated with medicines, if weight loss and exercise are not effective.  If you are 34 to 33 years old, ask your caregiver if you should take aspirin to prevent strokes.  Diabetes screening involves taking a blood sample to check your fasting blood  sugar level. This should be done once every 3 years, after age 26, if you are within normal weight and without risk factors for diabetes. Testing should be considered at a younger age or be carried out more frequently if  you are overweight and have at least 1 risk factor for diabetes.  Breast cancer screening is essential preventative care for women. You should practice "breast self-awareness." This means understanding the normal appearance and feel of your breasts and may include breast self-examination. Any changes detected, no matter how small, should be reported to a caregiver. Women in their 37s and 30s should have a clinical breast exam (CBE) by a caregiver as part of a regular health exam every 1 to 3 years. After age 56, women should have a CBE every year. Starting at age 55, women should consider having a mammogram (breast X-ray) every year. Women who have a family history of breast cancer should talk to their caregiver about genetic screening. Women at a high risk of breast cancer should talk to their caregiver about having an MRI and a mammogram every year.  Breast cancer gene (BRCA)-related cancer risk assessment is recommended for women who have family members with BRCA-related cancers. BRCA-related cancers include breast, ovarian, tubal, and peritoneal cancers. Having family members with these cancers may be associated with an increased risk for harmful changes (mutations) in the breast cancer genes BRCA1 and BRCA2. Results of the assessment will determine the need for genetic counseling and BRCA1 and BRCA2 testing.  The Pap test is a screening test for cervical cancer. Women should have a Pap test starting at age 47. Between ages 80 and 52, Pap tests should be repeated every 2 years. Beginning at age 29, you should have a Pap test every 3 years as long as the past 3 Pap tests have been normal. If you had a hysterectomy for a problem that was not cancer or a condition that could lead to cancer,  then you no longer need Pap tests. If you are between ages 11 and 70, and you have had normal Pap tests going back 10 years, you no longer need Pap tests. If you have had past treatment for cervical cancer or a condition that could lead to cancer, you need Pap tests and screening for cancer for at least 20 years after your treatment. If Pap tests have been discontinued, risk factors (such as a new sexual partner) need to be reassessed to determine if screening should be resumed. Some women have medical problems that increase the chance of getting cervical cancer. In these cases, your caregiver may recommend more frequent screening and Pap tests.  The human papillomavirus (HPV) test is an additional test that may be used for cervical cancer screening. The HPV test looks for the virus that can cause the cell changes on the cervix. The cells collected during the Pap test can be tested for HPV. The HPV test could be used to screen women aged 43 years and older, and should be used in women of any age who have unclear Pap test results. After the age of 37, women should have HPV testing at the same frequency as a Pap test.  Colorectal cancer can be detected and often prevented. Most routine colorectal cancer screening begins at the age of 28 and continues through age 55. However, your caregiver may recommend screening at an earlier age if you have risk factors for colon cancer. On a yearly basis, your caregiver may provide home test kits to check for hidden blood in the stool. Use of a small camera at the end of a tube, to directly examine the colon (sigmoidoscopy or colonoscopy), can detect the earliest forms of colorectal cancer. Talk to your caregiver about this at  age 33, when routine screening begins. Direct examination of the colon should be repeated every 5 to 10 years through age 9, unless early forms of pre-cancerous polyps or small growths are found.  Hepatitis C blood testing is recommended for all people  born from [birth year] through 1966 and any individual with known risks for hepatitis C.  Practice safe sex. Use condoms and avoid high-risk sexual practices to reduce the spread of sexually transmitted infections (STIs). Sexually active women aged 81 and younger should be checked for Chlamydia, which is a common sexually transmitted infection. Older women with new or multiple partners should also be tested for Chlamydia. Testing for other STIs is recommended if you are sexually active and at increased risk.  Osteoporosis is a disease in which the bones lose minerals and strength with aging. This can result in serious bone fractures. The risk of osteoporosis can be identified using a bone density scan. Women ages 55 and over and women at risk for fractures or osteoporosis should discuss screening with their caregivers. Ask your caregiver whether you should be taking a calcium supplement or vitamin D to reduce the rate of osteoporosis.  Menopause can be associated with physical symptoms and risks. Hormone replacement therapy is available to decrease symptoms and risks. You should talk to your caregiver about whether hormone replacement therapy is right for you.  Use sunscreen. Apply sunscreen liberally and repeatedly throughout the day. You should seek shade when your shadow is shorter than you. Protect yourself by wearing long sleeves, pants, a wide-brimmed hat, and sunglasses year round, whenever you are outdoors.  Notify your caregiver of new moles or changes in moles, especially if there is a change in shape or color. Also notify your caregiver if a mole is larger than the size of a pencil eraser.  Stay current with your immunizations. Document Released: 12/04/2010 Document Revised: 09/15/2012 Document Reviewed: 12/04/2010 [facility] Patient Information 2014 [place]. You Can Quit Smoking If you are ready to quit smoking or are thinking about it, congratulations! You have chosen to help yourself be  healthier and live longer! There are lots of different ways to quit smoking. Nicotine gum, nicotine patches, a nicotine inhaler, or nicotine nasal spray can help with physical craving. Hypnosis, support groups, and medicines help break the habit of smoking. TIPS TO GET OFF AND STAY OFF CIGARETTES  Learn to predict your moods. Do not let a bad situation be your excuse to have a cigarette. Some situations in your life might tempt you to have a cigarette.  Ask friends and co-workers not to smoke around you.  Make your home smoke-free.  Never have "just one" cigarette. It leads to wanting another and another. Remind yourself of your decision to quit.  On a card, make a list of your reasons for not smoking. Read it at least the same number of times a day as you have a cigarette. Tell yourself everyday, "I do not want to smoke. I choose not to smoke."  Ask someone at home or work to help you with your plan to quit smoking.  Have something planned after you eat or have a cup of coffee. Take a walk or get other exercise to perk you up. This will help to keep you from overeating.  Try a relaxation exercise to calm you down and decrease your stress. Remember, you may be tense and nervous the first two weeks after you quit. This will pass.  Find new activities to keep your hands busy.  Play with a pen, coin, or rubber band. Doodle or draw things on paper.  Brush your teeth right after eating. This will help cut down the craving for the taste of tobacco after meals. You can try mouthwash too.  Try gum, breath mints, or diet candy to keep something in your mouth. IF YOU SMOKE AND WANT TO QUIT:  Do not stock up on cigarettes. Never buy a carton. Wait until one pack is finished before you buy another.  Never carry cigarettes with you at work or at home.  Keep cigarettes as far away from you as possible. Leave them with someone else.  Never carry matches or a lighter with you.  Ask yourself, "Do I  need this cigarette or is this just a reflex?"  Bet with someone that you can quit. Put cigarette money in a piggy bank every morning. If you smoke, you give up the money. If you do not smoke, by the end of the week, you keep the money.  Keep trying. It takes 21 days to change a habit!  Talk to your doctor about using medicines to help you quit. These include nicotine replacement gum, lozenges, or skin patches. Document Released: 03/17/2009 Document Revised: 08/13/2011 Document Reviewed: 03/17/2009 [facility] Patient Information 2014 [place].

## 2013-06-16 NOTE — Progress Notes (Signed)
Pre-visit discussion using our clinic review tool. No additional management support is needed unless otherwise documented below in the visit note.  

## 2013-06-16 NOTE — Assessment & Plan Note (Signed)
Classic symptoms ongoing greater than 3 months time History of same but no prior medications or referral for counseling Verified no SI/HI today Discussion of options for treatment of same and patient agrees to trial medication Begin generic Paxil 10 mg daily - we reviewed potential risk/benefit and possible side effects - pt understands and agrees to same  Followup in 4-6 weeks for titration and review, patient agrees to call if problems prior to that time

## 2013-06-16 NOTE — Progress Notes (Signed)
   Subjective:    Patient ID: Cheryl SalonRachel K Pompey, female    DOB: [birth year], 33 y.o.   MRN: [id number]  HPI  patient is here today for annual physical. Patient feels well overall.  Reports spouse concerned about her "down" mood, ongoing since fall 2014 - hx same, but never on medications or counseling - self medicating with wine 3-6 glasses daily for last few months  Past Medical History  Diagnosis Date  . Depression   . History of chicken pox    Family History  Problem Relation Age of Onset  . Hyperlipidemia Mother   . Arthritis Father   . Hyperlipidemia Father   . Hypertension Father   . Hyperlipidemia Paternal Grandmother   . Hypertension Paternal Grandmother   . Hypertension Paternal Grandfather    History  Substance Use Topics  . Smoking status: Current Every Day Smoker -- 20 years    Types: Cigarettes  . 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 Alcohol Use: Yes    Review of Systems  Constitutional: Negative for fatigue and unexpected weight change.  Respiratory: Negative for cough, shortness of breath and wheezing.   Cardiovascular: Negative for chest pain, palpitations and leg swelling.  Gastrointestinal: Negative for nausea, abdominal pain and diarrhea.  Neurological: Negative for dizziness, weakness, light-headedness and headaches.  Psychiatric/Behavioral: Positive for dysphoric mood (>3 mo, exac by loss of job 04/2013, but now employed). Negative for suicidal ideas, sleep disturbance and self-injury. The patient is nervous/anxious.   All other systems reviewed and are negative.       Objective:   Physical Exam BP 112/70  Pulse 104  Temp(Src) 99.5 F (37.5 C) (Oral)  Ht 5\' 5"  (1.651 m)  Wt 150 lb 12.8 oz (68.402 kg)  BMI 25.09 kg/m2  SpO2 97% Wt Readings from Last 3 Encounters:  06/16/13 150 lb 12.8 oz (68.402 kg)   Constitutional: She appears well-developed and well-nourished. No distress.    HENT: Head: Normocephalic and atraumatic. Ears: B TMs ok, no erythema or effusion; Nose: Nose normal. Mouth/Throat: Oropharynx is clear and moist. No oropharyngeal exudate.  Eyes: Conjunctivae and EOM are normal. Pupils are equal, round, and reactive to light. No scleral icterus.  Neck: Normal range of motion. Neck supple. No JVD present. No thyromegaly present.  Cardiovascular: Normal rate, regular rhythm and normal heart sounds.  No murmur heard. No BLE edema. Pulmonary/Chest: Effort normal and breath sounds normal. No respiratory distress. She has no wheezes.  Abdominal: Soft. Bowel sounds are normal. She exhibits no distension. There is no tenderness. no masses Musculoskeletal: Normal range of motion, no joint effusions. No gross deformities Neurological: She is alert and oriented to person, place, and time. No cranial nerve deficit. Coordination, balance, strength, speech and gait are normal.  Skin: Skin is warm and dry. No rash noted. No erythema.  Psychiatric: She has a mild anxious and dysphoric mood and affect. Her behavior is normal. Judgment and thought content normal.   Lab Results  Component Value Date   HGB 14.8 04/18/2007       Assessment & Plan:    CPX/v70.0- Patient has been counseled on age-appropriate routine health concerns for screening and prevention. These are reviewed and up-to-date. Immunizations are up-to-date or declined. Labs ordered and reviewed.  Also See problem list. Medications and labs reviewed today.

## 2013-07-21 ENCOUNTER — Ambulatory Visit: Payer: BC Managed Care – PPO | Admitting: Internal Medicine

## 2013-08-04 ENCOUNTER — Ambulatory Visit: Payer: BC Managed Care – PPO | Admitting: Internal Medicine

## 2013-08-27 ENCOUNTER — Encounter: Payer: Self-pay | Admitting: Internal Medicine

## 2013-08-27 ENCOUNTER — Ambulatory Visit (INDEPENDENT_AMBULATORY_CARE_PROVIDER_SITE_OTHER): Payer: BC Managed Care – PPO | Admitting: Internal Medicine

## 2013-08-27 VITALS — BP 102/80 | HR 79 | Temp 98.0°F | Wt 154.1 lb

## 2013-08-27 DIAGNOSIS — F3289 Other specified depressive episodes: Secondary | ICD-10-CM

## 2013-08-27 DIAGNOSIS — F329 Major depressive disorder, single episode, unspecified: Secondary | ICD-10-CM

## 2013-08-27 DIAGNOSIS — F32A Depression, unspecified: Secondary | ICD-10-CM

## 2013-08-27 MED ORDER — PAROXETINE HCL 20 MG PO TABS: 20.0000 mg | ORAL_TABLET | Freq: Every day | ORAL | Status: DC

## 2013-08-27 NOTE — Assessment & Plan Note (Signed)
Classic symptoms ongoing greater than 3 months time: irritability, dysphoria, feeling down, easily tearful History of same but no prior medications or referral for counseling Verified no SI/HI today Began generic Paxil 10 mg daily 06/2013 Improved (per spouse) but not at goal  Increase dose now Followup in 6-8 weeks for titration and review, patient agrees to call if problems prior to that time

## 2013-08-27 NOTE — Patient Instructions (Signed)
It was good to see you today.  We have reviewed your prior records including labs and tests today  Medications reviewed and updated Increase Paxil dose to 20mg  daily - no other changes recommended at this time.  Your prescription(s) have been submitted to your pharmacy. Please take as directed and contact our office if you believe you are having problem(s) with the medication(s).  Please schedule followup in 6-8 weeks for medication and symptoms recheck, call sooner if problems.

## 2013-08-27 NOTE — Progress Notes (Signed)
   Subjective:    Patient ID: Cheryl SalonRachel K Sherfield, female    DOB: 1980/10/14, 33 y.o.   MRN: [id number]  HPI  Patient is here for follow up  Reviewed chronic medical issues and interval medical events  Past Medical History  Diagnosis Date  . Depression   . History of chicken pox     Review of Systems  Constitutional: Positive for fatigue. Negative for fever and unexpected weight change.  Respiratory: Negative for cough and shortness of breath.   Cardiovascular: Negative for chest pain and leg swelling.  Psychiatric/Behavioral: Positive for dysphoric mood and decreased concentration. Negative for suicidal ideas, confusion, sleep disturbance and self-injury. Agitation: irritable. The patient is nervous/anxious.        Objective:   Physical Exam  BP 102/80  Pulse 79  Temp(Src) 98 F (36.7 C) (Oral)  Wt 154 lb 1.9 oz (69.908 kg)  SpO2 98% Wt Readings from Last 3 Encounters:  08/27/13 154 lb 1.9 oz (69.908 kg)  06/16/13 150 lb 12.8 oz (68.402 kg)   Constitutional: She appears well-developed and well-nourished. No distress.  Neck: Normal range of motion. Neck supple. No JVD present. No thyromegaly present.  Cardiovascular: Normal rate, regular rhythm and normal heart sounds.  No murmur heard. No BLE edema. Pulmonary/Chest: Effort normal and breath sounds normal. No respiratory distress. She has no wheezes.  Psychiatric: She has a flat affect and dysphoric mood. Her behavior is normal. Judgment and thought content normal.   Lab Results  Component Value Date   WBC 7.4 06/16/2013   HGB 14.5 06/16/2013   HCT 42.5 06/16/2013   PLT 255.0 06/16/2013   GLUCOSE 86 06/16/2013   CHOL 204* 06/16/2013   TRIG 137.0 06/16/2013   HDL 78.30 06/16/2013   LDLDIRECT 63.5 06/16/2013   ALT 20 06/16/2013   AST 20 06/16/2013   NA 138 06/16/2013   K 4.7 06/16/2013   CL 107 06/16/2013   CREATININE 0.7 06/16/2013   BUN 12 06/16/2013   CO2 23 06/16/2013   TSH 1.17 06/16/2013    No results found.       Assessment & Plan:   Problem List Items Addressed This Visit   Depression - Primary     Classic symptoms ongoing greater than 3 months time: irritability, dysphoria, feeling down, easily tearful History of same but no prior medications or referral for counseling Verified no SI/HI today Began generic Paxil 10 mg daily 06/2013 Improved (per spouse) but not at goal  Increase dose now Followup in 6-8 weeks for titration and review, patient agrees to call if problems prior to that time     Relevant Medications      PARoxetine (PAXIL) tablet

## 2013-08-27 NOTE — Progress Notes (Signed)
Pre visit review using our clinic review tool, if applicable. No additional management support is needed unless otherwise documented below in the visit note. 

## 2013-08-28 ENCOUNTER — Telehealth: Payer: Self-pay | Admitting: Internal Medicine

## 2013-08-28 NOTE — Telephone Encounter (Signed)
Relevant patient education assigned to patient using Emmi. ° °

## 2013-10-06 ENCOUNTER — Encounter: Payer: Self-pay | Admitting: Internal Medicine

## 2013-10-06 ENCOUNTER — Ambulatory Visit (INDEPENDENT_AMBULATORY_CARE_PROVIDER_SITE_OTHER): Payer: BC Managed Care – PPO | Admitting: Internal Medicine

## 2013-10-06 VITALS — BP 102/68 | HR 75 | Temp 98.8°F | Wt 160.0 lb

## 2013-10-06 DIAGNOSIS — F329 Major depressive disorder, single episode, unspecified: Secondary | ICD-10-CM

## 2013-10-06 DIAGNOSIS — F32A Depression, unspecified: Secondary | ICD-10-CM

## 2013-10-06 DIAGNOSIS — F17200 Nicotine dependence, unspecified, uncomplicated: Secondary | ICD-10-CM

## 2013-10-06 DIAGNOSIS — F3289 Other specified depressive episodes: Secondary | ICD-10-CM

## 2013-10-06 MED ORDER — VARENICLINE TARTRATE 0.5 MG X 11 & 1 MG X 42 PO MISC: ORAL | Status: DC

## 2013-10-06 NOTE — Assessment & Plan Note (Signed)
Classic symptoms ongoing greater than 3 months time: irritability, dysphoria, feeling down, easily tearful History of same but no prior medications or referral for counseling before 06/2013 Verified no SI/HI today Began generic Paxil 10 mg daily 06/2013, increased to 20mg  08/2013 Feels well, spouse agrees improved The current medical regimen is effective;  continue present plan and medications.

## 2013-10-06 NOTE — Assessment & Plan Note (Signed)
5 minutes today spent counseling patient on unhealthy effects of continued tobacco abuse and encouragement of cessation including medical options available to help the patient quit smoking. Will rx chantix - we reviewed potential risk/benefit and possible side effects - pt understands and agrees to same  Pt to set date and call if problems

## 2013-10-06 NOTE — Progress Notes (Signed)
Pre visit review using our clinic review tool, if applicable. No additional management support is needed unless otherwise documented below in the visit note. 

## 2013-10-06 NOTE — Patient Instructions (Addendum)
It was good to see you today.  We have reviewed your prior records including labs and tests today  Medications reviewed and updated Continue same dose Paxil without change Begin Chantix to help you with smoking cessation as discussed. Please begin on your scheduled quit date  Please schedule followup in 6-12 months for review, call sooner if problems.  Smoking Cessation, Tips for Success If you are ready to quit smoking, congratulations! You have chosen to help yourself be healthier. Cigarettes bring nicotine, tar, carbon monoxide, and other irritants into your body. Your lungs, heart, and blood vessels will be able to work better without these poisons. There are many different ways to quit smoking. Nicotine gum, nicotine patches, a nicotine inhaler, or nicotine nasal spray can help with physical craving. Hypnosis, support groups, and medicines help break the habit of smoking. WHAT THINGS CAN I DO TO MAKE QUITTING EASIER?  Here are some tips to help you quit for good:  Pick a date when you will quit smoking completely. Tell all of your friends and family about your plan to quit on that date.  Do not try to slowly cut down on the number of cigarettes you are smoking. Pick a quit date and quit smoking completely starting on that day.  Throw away all cigarettes.   Clean and remove all ashtrays from your home, work, and car.   On a card, write down your reasons for quitting. Carry the card with you and read it when you get the urge to smoke.   Cleanse your body of nicotine. Drink enough water and fluids to keep your urine clear or pale yellow. Do this after quitting to flush the nicotine from your body.   Learn to predict your moods. Do not let a bad situation be your excuse to have a cigarette. Some situations in your life might tempt you into wanting a cigarette.   Never have "just one" cigarette. It leads to wanting another and another. Remind yourself of your decision to quit.    Change habits associated with smoking. If you smoked while driving or when feeling stressed, try other activities to replace smoking. Stand up when drinking your coffee. Brush your teeth after eating. Sit in a different chair when you read the paper. Avoid alcohol while trying to quit, and try to drink fewer caffeinated beverages. Alcohol and caffeine may urge you to smoke.   Avoid foods and drinks that can trigger a desire to smoke, such as sugary or spicy foods and alcohol.   Ask people who smoke not to smoke around you.   Have something planned to do right after eating or having a cup of coffee. For example, plan to take a walk or exercise.   Try a relaxation exercise to calm you down and decrease your stress. Remember, you may be tense and nervous for the first 2 weeks after you quit, but this will pass.   Find new activities to keep your hands busy. Play with a pen, coin, or rubber band. Doodle or draw things on paper.   Brush your teeth right after eating. This will help cut down on the craving for the taste of tobacco after meals. You can also try mouthwash.   Use oral substitutes in place of cigarettes. Try using lemon drops, carrots, cinnamon sticks, or chewing gum. Keep them handy so they are available when you have the urge to smoke.   When you have the urge to smoke, try deep breathing.   Designate  your home as a nonsmoking area.   If you are a heavy smoker, ask your health care provider about a prescription for nicotine chewing gum. It can ease your withdrawal from nicotine.   Reward yourself. Set aside the cigarette money you save and buy yourself something nice.   Look for support from others. Join a support group or smoking cessation program. Ask someone at home or at work to help you with your plan to quit smoking.   Always ask yourself, "Do I need this cigarette or is this just a reflex?" Tell yourself, "Today, I choose not to smoke," or "I do not want to  smoke." You are reminding yourself of your decision to quit.  Do not replace cigarette smoking with electronic cigarettes (commonly called e-cigarettes). The safety of e-cigarettes is unknown, and some may contain harmful chemicals.  If you relapse, do not give up! Plan ahead and think about what you will do the next time you get the urge to smoke.  HOW WILL I FEEL WHEN I QUIT SMOKING? You may have symptoms of withdrawal because your body is used to nicotine (the addictive substance in cigarettes). You may crave cigarettes, be irritable, feel very hungry, cough often, get headaches, or have difficulty concentrating. The withdrawal symptoms are only temporary. They are strongest when you first quit but will go away within 10 14 days. When withdrawal symptoms occur, stay in control. Think about your reasons for quitting. Remind yourself that these are signs that your body is healing and getting used to being without cigarettes. Remember that withdrawal symptoms are easier to treat than the major diseases that smoking can cause.  Even after the withdrawal is over, expect periodic urges to smoke. However, these cravings are generally short lived and will go away whether you smoke or not. Do not smoke!  WHAT RESOURCES ARE AVAILABLE TO HELP ME QUIT SMOKING? Your health care provider can direct you to community resources or hospitals for support, which may include:  Group support.  Education.  Hypnosis.  Therapy. Document Released: 02/17/2004 Document Revised: 03/11/2013 Document Reviewed: 11/06/2012 [facility] Patient Information 2014 [employer], [place].

## 2013-10-06 NOTE — Progress Notes (Signed)
   Subjective:    Patient ID: Cheryl Soto, female    DOB: 1980/07/29, 33 y.o.   MRN: [id number]  HPI  Patient is here for follow up  Reviewed chronic medical issues and interval medical events  Past Medical History  Diagnosis Date  . Depression   . History of chicken pox     Review of Systems  Respiratory: Negative for cough, shortness of breath and wheezing.   Cardiovascular: Negative for chest pain and leg swelling.  Psychiatric/Behavioral: Negative for suicidal ideas, hallucinations, behavioral problems, confusion, sleep disturbance, self-injury, dysphoric mood and decreased concentration. The patient is not nervous/anxious and is not hyperactive.        Objective:   Physical Exam  BP 102/68  Pulse 75  Temp(Src) 98.8 F (37.1 C) (Oral)  Wt 160 lb (72.576 kg)  SpO2 98% Wt Readings from Last 3 Encounters:  10/06/13 160 lb (72.576 kg)  08/27/13 154 lb 1.9 oz (69.908 kg)  06/16/13 150 lb 12.8 oz (68.402 kg)    Constitutional: She appears well-developed and well-nourished. No distress.  Neck: Normal range of motion. Neck supple. No JVD present. No thyromegaly present.  Cardiovascular: Normal rate, regular rhythm and normal heart sounds.  No murmur heard. No BLE edema. Pulmonary/Chest: Effort normal and breath sounds normal. No respiratory distress. She has no wheezes.  Psychiatric: She has a normal mood and affect. Her behavior is normal. Judgment and thought content normal.   Lab Results  Component Value Date   WBC 7.4 06/16/2013   HGB 14.5 06/16/2013   HCT 42.5 06/16/2013   PLT 255.0 06/16/2013   GLUCOSE 86 06/16/2013   CHOL 204* 06/16/2013   TRIG 137.0 06/16/2013   HDL 78.30 06/16/2013   LDLDIRECT 63.5 06/16/2013   ALT 20 06/16/2013   AST 20 06/16/2013   NA 138 06/16/2013   K 4.7 06/16/2013   CL 107 06/16/2013   CREATININE 0.7 06/16/2013   BUN 12 06/16/2013   CO2 23 06/16/2013   TSH 1.17 06/16/2013    No results found.     Assessment & Plan:   Problem List  Items Addressed This Visit   Depression - Primary     Classic symptoms ongoing greater than 3 months time: irritability, dysphoria, feeling down, easily tearful History of same but no prior medications or referral for counseling before 06/2013 Verified no SI/HI today Began generic Paxil 10 mg daily 06/2013, increased to 20mg  08/2013 Feels well, spouse agrees improved The current medical regimen is effective;  continue present plan and medications.     Tobacco use disorder     5 minutes today spent counseling patient on unhealthy effects of continued tobacco abuse and encouragement of cessation including medical options available to help the patient quit smoking. Will rx chantix - we reviewed potential risk/benefit and possible side effects - pt understands and agrees to same  Pt to set date and call if problems    Relevant Medications      varenicline (CHANTIX PAK) 0.5 MG X 11 & 1 MG X 42 tablet

## 2014-02-19 ENCOUNTER — Ambulatory Visit (INDEPENDENT_AMBULATORY_CARE_PROVIDER_SITE_OTHER): Payer: BC Managed Care – PPO | Admitting: Family Medicine

## 2014-02-19 VITALS — BP 120/80 | HR 97 | Temp 98.2°F | Resp 16 | Ht 66.0 in | Wt 166.0 lb

## 2014-02-19 DIAGNOSIS — R05 Cough: Secondary | ICD-10-CM

## 2014-02-19 DIAGNOSIS — R51 Headache: Secondary | ICD-10-CM

## 2014-02-19 DIAGNOSIS — R059 Cough, unspecified: Secondary | ICD-10-CM

## 2014-02-19 DIAGNOSIS — J0120 Acute ethmoidal sinusitis, unspecified: Secondary | ICD-10-CM

## 2014-02-19 MED ORDER — ALBUTEROL SULFATE HFA 108 (90 BASE) MCG/ACT IN AERS: 2.0000 | INHALATION_SPRAY | Freq: Four times a day (QID) | RESPIRATORY_TRACT | Status: DC | PRN

## 2014-02-19 MED ORDER — BENZONATATE 100 MG PO CAPS: 200.0000 mg | ORAL_CAPSULE | Freq: Two times a day (BID) | ORAL | Status: DC | PRN

## 2014-02-19 MED ORDER — HYDROCODONE-HOMATROPINE 5-1.5 MG/5ML PO SYRP: 5.0000 mL | ORAL_SOLUTION | Freq: Every evening | ORAL | Status: DC | PRN

## 2014-02-19 MED ORDER — AZITHROMYCIN 250 MG PO TABS: ORAL_TABLET | ORAL | Status: DC

## 2014-02-19 NOTE — Progress Notes (Signed)
Chief Complaint:  Chief Complaint  Patient presents with  . Sinusitis  . Cough    HPI: Cheryl Soto is a 33 y.o. female who is here for  1 week  sinusitis sxs; Was sneezing and then ears hurt and then has gone to her throat and last couple of days it has been in her face and teeth and cheeks, she has tried emergenC and some cough suppressant otc, not working well. She feels tight in the chest when she coughs and is not the same as yesterday. She deneis SOB or wheezing. She is a smoker.  Past Medical History  Diagnosis Date  . Depression   . History of chicken pox    Past Surgical History  Procedure Laterality Date  . Tonsillectomy  2008   History   Social History  . Marital Status: Single    Spouse Name: N/A    Number of Children: N/A  . Years of Education: N/A   Social History Main Topics  . Smoking status: Current Every Day Smoker -- 20 years    Types: Cigarettes  . 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 Alcohol Use: Yes  . Drug Use: No  . Sexual Activity: None   Other Topics Concern  . None   Social History Narrative  . None   Family History  Problem Relation Age of Onset  . Hyperlipidemia Mother   . Arthritis Father   . Hyperlipidemia Father   . Hypertension Father   . Hyperlipidemia Paternal Grandmother   . Hypertension Paternal Grandmother   . Hypertension Paternal Grandfather    Allergies  Allergen Reactions  . Amoxil [Amoxicillin]    Prior to Admission medications   Medication Sig Start Date End Date Taking? Authorizing Provider  PARoxetine (PAXIL) 20 MG tablet Take 1 tablet (20 mg total) by mouth daily. 08/27/13  Yes Newt Lukes, MD  varenicline (CHANTIX PAK) 0.5 MG X 11 & 1 MG X 42 tablet Take one 0.5 mg tablet by mouth once daily for 3 days, then increase to one 0.5 mg tablet twice daily for 4 days, then increase to one 1 mg tablet twice daily. 10/06/13   Newt Lukes, MD     ROS: The patient denies fevers, chills, night sweats, unintentional weight loss, chest pain, palpitations, wheezing, dyspnea on exertion, nausea, vomiting, abdominal pain, dysuria, hematuria, melena, numbness, weakness, or tingling.   All other systems have been reviewed and were otherwise negative with the exception of those mentioned in the HPI and as above.    PHYSICAL EXAM: Filed Vitals:   02/19/14 1746  BP: 120/80  Pulse: 97  Temp: 98.2 F (36.8 C)  Resp: 16   Filed Vitals:   02/19/14 1746  Height:  (1.676 m)  Weight: 166 lb (75.297 kg)   Body mass index is 26.81 kg/(m^2).  General: Alert, no acute distress HEENT:  Normocephalic, atraumatic, oropharynx patent. EOMI, PERRLA, + sinus tenderness, TM normal, no exudates Cardiovascular:  Regular rate and rhythm, no rubs murmurs or gallops.  No Carotid bruits, radial pulse intact. No pedal edema.  Respiratory: Clear to auscultation bilaterally.  No wheezes, rales, or rhonchi.  No cyanosis, no use of accessory musculature GI: No organomegaly, abdomen is soft and non-tender, positive bowel sounds.  No masses. Skin: No rashes. Neurologic: Facial musculature symmetric. Psychiatric: Patient is appropriate throughout our interaction. Lymphatic: No cervical lymphadenopathy Musculoskeletal:  Gait intact.   LABS:    EKG/XRAY:   Primary read interpreted by Dr. Conley Rolls at [facility].   ASSESSMENT/PLAN: Encounter Diagnoses  Name Primary?  . Acute ethmoidal sinusitis, recurrence not specified Yes  . Cough   . Headache(784.0)    Rx Z pack, hycodan tessalon perles F/u prn  Gross sideeffects, risk and benefits, and alternatives of medications d/w patient. Patient is aware that all medications have potential sideeffects and we are unable to predict every sideeffect or drug-drug interaction that may occur.  Cheryl Mixon PHUONG, DO 02/19/2014 7:23 PM

## 2014-02-19 NOTE — Patient Instructions (Signed)

## 2014-02-24 ENCOUNTER — Other Ambulatory Visit: Payer: Self-pay | Admitting: Family Medicine

## 2014-04-09 ENCOUNTER — Ambulatory Visit: Payer: BC Managed Care – PPO | Admitting: Internal Medicine

## 2014-04-09 DIAGNOSIS — Z0289 Encounter for other administrative examinations: Secondary | ICD-10-CM

## 2014-04-21 ENCOUNTER — Ambulatory Visit (INDEPENDENT_AMBULATORY_CARE_PROVIDER_SITE_OTHER): Payer: BC Managed Care – PPO | Admitting: Emergency Medicine

## 2014-04-21 VITALS — BP 126/82 | HR 84 | Temp 98.6°F | Resp 16 | Ht 66.5 in | Wt 176.0 lb

## 2014-04-21 DIAGNOSIS — Z23 Encounter for immunization: Secondary | ICD-10-CM

## 2014-04-21 DIAGNOSIS — S61412A Laceration without foreign body of left hand, initial encounter: Secondary | ICD-10-CM

## 2014-04-21 DIAGNOSIS — M79642 Pain in left hand: Secondary | ICD-10-CM

## 2014-04-21 MED ORDER — CLINDAMYCIN HCL 300 MG PO CAPS: 300.0000 mg | ORAL_CAPSULE | Freq: Three times a day (TID) | ORAL | Status: DC

## 2014-04-21 NOTE — Patient Instructions (Signed)

## 2014-04-21 NOTE — Progress Notes (Signed)
Verbal consent obtained from patient.  Local anesthesia with 10cc Lidocaine 2% with epinephrine.  Wound scrubbed with soap and water and rinsed.  Wounds on the dorsal surface of the hand and wrist  closed with #6 5-0 Ethilon (#4 SI, #2 HM) sutures.  Wounds on the palm are superficial and do not require suture repair. Wounds cleansed and dressed.

## 2014-04-21 NOTE — Progress Notes (Signed)
[facility] [address], [place] [id number] [phone]  Date:  04/21/2014   Name:  Cheryl SalonRachel K Santoro   DOB:  Oct 07, 1980   MRN:  [id number]  PCP:  Rene PaciValerie Leschber, MD    Chief Complaint: Animal Bite   History of Present Illness:  Cheryl Soto is a 33 y.o. very pleasant female patient who presents with the following:  Awakened to the sound of her dogs fighting.  In attempt to disengage them, she was bitten on the left hand She is not current on TD and her dogs are current on rabies. She has multiple bites on the palmar and dorsum of her hand and wrist. No improvement with over the counter medications or other home remedies.  Denies other complaint or health concern today.   Patient Active Problem List   Diagnosis Date Noted  . Tobacco use disorder 06/16/2013  . Depression     Past Medical History  Diagnosis Date  . Depression   . History of chicken pox     Past Surgical History  Procedure Laterality Date  . Tonsillectomy  2008    History  Substance Use Topics  . Smoking status: Current Every Day Smoker -- 20 years    Types: Cigarettes  . 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 Alcohol Use: 0.0 oz/week    0 Not specified per week    Family History  Problem Relation Age of Onset  . Hyperlipidemia Mother   . Arthritis Father   . Hyperlipidemia Father   . Hypertension Father   . Hyperlipidemia Paternal Grandmother   . Hypertension Paternal Grandmother   . Hypertension Paternal Grandfather     Allergies  Allergen Reactions  . Amoxil [Amoxicillin]     Medication list has been reviewed and updated.  Current Outpatient Prescriptions on File Prior to Visit  Medication Sig Dispense Refill  . PARoxetine (PAXIL) 20 MG tablet Take 1 tablet (20 mg total) by mouth daily. 30 tablet 5  . albuterol (PROVENTIL HFA;VENTOLIN HFA) 108 (90 BASE) MCG/ACT inhaler Inhale 2 puffs  into the lungs every 6 (six) hours as needed for wheezing or shortness of breath. 1 Inhaler 0  . HYDROcodone-homatropine (HYCODAN) 5-1.5 MG/5ML syrup Take 5 mLs by mouth at bedtime as needed. 120 mL 0  . varenicline (CHANTIX PAK) 0.5 MG X 11 & 1 MG X 42 tablet Take one 0.5 mg tablet by mouth once daily for 3 days, then increase to one 0.5 mg tablet twice daily for 4 days, then increase to one 1 mg tablet twice daily. 53 tablet 0   No current facility-administered medications on file prior to visit.    Review of Systems:  As per HPI, otherwise negative.    Physical Examination: Filed Vitals:   04/21/14 0825  BP: 126/82  Pulse: 84  Temp: 98.6 F (37 C)  Resp: 16   Filed Vitals:   04/21/14 0825  Height: 5' 6.5" (1.689 m)  Weight: 176 lb (79.833 kg)   Body mass index is 27.98 kg/(m^2). Ideal Body Weight: Weight in (lb) to have BMI = 25: 156.9   GEN: WDWN, NAD, Non-toxic, Alert & Oriented x 3 HEENT: Atraumatic, Normocephalic.  Ears and Nose: No external deformity. EXTR: No clubbing/cyanosis/edema NEURO: Normal gait.  PSYCH: Normally interactive. Conversant. Not depressed or anxious appearing.  Calm demeanor.  LEFT hand:  Numerous superficial skin tears on palmar  surface.  Two lacerations dorsal hand and wrist.  No FB.  NATI clinically  Assessment and Plan: Hand pain Lacerations hand   Signed,  Phillips OdorJeffery Fidela Cieslak, MD

## 2014-06-02 ENCOUNTER — Other Ambulatory Visit: Payer: Self-pay | Admitting: Internal Medicine

## 2014-12-31 ENCOUNTER — Encounter: Payer: Self-pay | Admitting: Internal Medicine

## 2014-12-31 ENCOUNTER — Ambulatory Visit (INDEPENDENT_AMBULATORY_CARE_PROVIDER_SITE_OTHER): Payer: BLUE CROSS/BLUE SHIELD | Admitting: Internal Medicine

## 2014-12-31 VITALS — BP 128/84 | HR 100 | Temp 98.5°F | Resp 16 | Wt 175.0 lb

## 2014-12-31 DIAGNOSIS — R000 Tachycardia, unspecified: Secondary | ICD-10-CM | POA: Diagnosis not present

## 2014-12-31 MED ORDER — PAROXETINE HCL 20 MG PO TABS: 20.0000 mg | ORAL_TABLET | Freq: Every day | ORAL | Status: DC

## 2014-12-31 NOTE — Progress Notes (Signed)
   Subjective:    Patient ID: Cheryl Soto, female    DOB: 1981/04/05, 34 y.o.   MRN: [id number]  HPI She is here for refill of her paroxetine. She went off of it last December and fell into deep depression. She actually inflicted a cut her R forearm in a suicide gesture in May of the shear. This in the context of alcohol abuse.Marland Kitchen Her husband found her and treated the bleeding. There  were no sequela. She was not seen by medical or mental health professional.  She denies anxiety, difficulty triaging, panic attacks, or frank depression this time. She does continue have some occasional irritability  She has some restless sleep. She states that her husband has not described snoring or apnea.   She is smoking half pack per day and expresses interest in stopping. She's questioning using Chantix which she used unsuccessfully before.  She is taking antihistamines; but she denies ingestion of decongestions. She has at least 2-3 cups of coffee a day.  She has no cardiopulmonary symptoms except for some exertional dyspnea. At night she may have numbness and tingling in left upper extremity.  Her father is an alcoholic. She describes significant mental health problems among her mother's family. She states her maternal great uncle actually committed murder and then killed himself.  Last labs on record 06/16/13. TSH was 1.17 at that time. Lipids were excellent with an LDL of 63.5 and HDL 78.3.  Review of Systems  Chest pain, palpitations, tachycardia (see pulse), exertional dyspnea, paroxysmal nocturnal dyspnea, claudication or edema are absent.      Objective:   Physical Exam  Pertinent or positive findings include: She exhibits a resting tachycardia of 105. Deep tendon reflexes are 1.5+ and equal. Oriented X 3. Open and communicative. .  General appearance :adequately nourished; in no distress.  Eyes: No conjunctival inflammation or scleral icterus is present.EOMI; no lid lag or  proptosis.  Oral exam:  Lips and gums are healthy appearing.There is no oropharyngeal erythema or exudate noted. Dental hygiene is good.  Heart:  Regular rhythm. S1 and S2 normal without murmur, click, rub or other extra sounds    Lungs:Chest clear to auscultation; no wheezes, rhonchi,rales ,or rubs present.No increased work of breathing.   Abdomen: bowel sounds normal, soft and non-tender without masses, organomegaly or hernias noted.  No guarding or rebound.   Vascular : all pulses equal ; no bruits present.  Skin:Warm & dry.  Intact without suspicious lesions or rashes ; no tenting or jaundice   Lymphatic: No lymphadenopathy is noted about the head, neck, axilla  Neuro: Strength, tone  normal.        Assessment & Plan:  #1 depression, good response to the generic Paxil. Pathophysiology of neurotransmitter deficiency was discussed  #2 smoker, she is expressing interest in stopping.Various modalities were discussed. Chantix problematic due to pre-existing sleep dysfunction because of possible reported cns effects (Ex nightmares)  #3 tachycardia  Plan: See orders and after visit summary

## 2014-12-31 NOTE — Progress Notes (Signed)
Patient received education resource, including the self-management goal and tool. Patient verbalized understanding. 

## 2014-12-31 NOTE — Patient Instructions (Signed)
  Please think about quitting smoking. Review the risks we discussed. Please call 1-800-QUIT-NOW [phone] [phone]) for free smoking cessation counseling. There are multiple options are to help you stop smoking. These include nicotine patches, nicotine gum, and the new "E cigarette".   To prevent fast heart rate  & palpitations or premature beats, avoid stimulants such as decongestants, diet pills, nicotine, or caffeine (coffee, tea, cola, or chocolate) to excess.

## 2015-05-09 ENCOUNTER — Ambulatory Visit (INDEPENDENT_AMBULATORY_CARE_PROVIDER_SITE_OTHER): Payer: BLUE CROSS/BLUE SHIELD | Admitting: Internal Medicine

## 2015-05-09 ENCOUNTER — Encounter: Payer: Self-pay | Admitting: Internal Medicine

## 2015-05-09 VITALS — BP 128/78 | HR 108 | Temp 98.6°F | Wt 186.0 lb

## 2015-05-09 DIAGNOSIS — M545 Low back pain, unspecified: Secondary | ICD-10-CM

## 2015-05-09 DIAGNOSIS — R195 Other fecal abnormalities: Secondary | ICD-10-CM

## 2015-05-09 MED ORDER — PREDNISONE 20 MG PO TABS
20.0000 mg | ORAL_TABLET | Freq: Two times a day (BID) | ORAL | Status: AC
Start: 2015-05-09 — End: ?

## 2015-05-09 MED ORDER — TRAMADOL HCL 50 MG PO TABS: 50.0000 mg | ORAL_TABLET | Freq: Four times a day (QID) | ORAL | Status: AC | PRN

## 2015-05-09 MED ORDER — CYCLOBENZAPRINE HCL 5 MG PO TABS: ORAL_TABLET | ORAL | Status: AC

## 2015-05-09 NOTE — Progress Notes (Signed)
   Subjective:    Patient ID: Cheryl Soto, female    DOB: 07-Jul-1980, 34 y.o.   MRN: [id number]  HPI Her symptoms began 1 week ago as "nagging pain" in the lumbosacral spine area. There was no definite trigger such as repetitive motion or injury. Last night it became severe and was unresponsive to Tylenol or heating pad. The pain is bilateral and does radiate to both inguinal areas.  She has no associated neuromuscular symptoms. She has had some loose stool. She denies other GI or GU symptoms.  Past history includes fractured coccyx sustained in a fall from the back of a truck 8 feet onto concrete.   Review of Systems .Fever, chills, sweats, or unexplained weight loss not present. Vertigo, near syncope or imbalance denied. There is no numbness, tingling, or weakness in extremities.   No loss of control of bladder or bowels. Radicular type pain absent. No seizure stigmata. Dysuria, pyuria, hematuria, frequency, nocturia or polyuria are denied. Unexplained weight loss, abdominal pain, significant dyspepsia, dysphagia, melena, rectal bleeding, or persistently small caliber stools are denied.      Objective:   Physical Exam Pertinent or positive findings include: Strength equal & normal in upper & lower extremities Able to walk on heels and toes.   Balance normal  Deep tendon reflexes are normal. Neg SLR. Classic low back crawl patten exhibited getting from chair and exam table. Some LS  tenderness to percussion.  General appearance :adequately nourished; in no distress.  Eyes: No conjunctival inflammation or scleral icterus is present.  Heart:  regular rhythm. S1 and S2 normal without gallop, murmur, click, rub or other extra sounds    Lungs:Chest clear to auscultation; no wheezes, rhonchi,rales ,or rubs present.No increased work of breathing.   Abdomen: bowel sounds normal, soft and non-tender without masses, organomegaly or hernias noted.  No guarding or  rebound.  Vascular : all pulses equal ; no bruits present.  Skin:Warm & dry.  Intact without suspicious lesions or rashes ; no tenting or jaundice   Lymphatic: No lymphadenopathy is noted about the head, neck, axilla    Assessment & Plan:  #1 acute low back pain  Plan: Pain medication  Muscle relaxant  Prednisone  Low back exercises discussed  Imaging and physical therapy if no better  #2 loose stool  #3 smoker; risk discussed

## 2015-05-09 NOTE — Progress Notes (Signed)
Pre visit review using our clinic review tool, if applicable. No additional management support is needed unless otherwise documented below in the visit note. 

## 2015-05-09 NOTE — Patient Instructions (Addendum)
Use an anti-inflammatory cream such as Aspercreme or Zostrix cream twice a day to the affected area as needed. In lieu of this warm moist compresses or  hot water bottle can be used. Do not apply ice . The best exercises for the low back include freestyle swimming, stretch aerobics, and yoga.Cybex & Nautilus machines rather than dead weights are better for the back.  Please take a probiotic , Florastor OR Align, every day if the bowels are loose. This will replace the normal bacteria which  are necessary for formation of normal stool and processing of food.

## 2015-06-05 ENCOUNTER — Emergency Department (HOSPITAL_COMMUNITY)
Admission: EM | Admit: 2015-06-05 | Discharge: 2015-06-05 | Disposition: A | Discharge status: Home / Self Care | Payer: BLUE CROSS/BLUE SHIELD | Attending: Emergency Medicine | Admitting: Emergency Medicine

## 2015-06-05 ENCOUNTER — Encounter (HOSPITAL_COMMUNITY): Payer: Self-pay | Admitting: Emergency Medicine

## 2015-06-05 DIAGNOSIS — Z880 Allergy status to penicillin: Secondary | ICD-10-CM | POA: Diagnosis not present

## 2015-06-05 DIAGNOSIS — Z79899 Other long term (current) drug therapy: Secondary | ICD-10-CM | POA: Diagnosis not present

## 2015-06-05 DIAGNOSIS — F17210 Nicotine dependence, cigarettes, uncomplicated: Secondary | ICD-10-CM | POA: Insufficient documentation

## 2015-06-05 DIAGNOSIS — K002 Abnormalities of size and form of teeth: Secondary | ICD-10-CM | POA: Diagnosis not present

## 2015-06-05 DIAGNOSIS — K029 Dental caries, unspecified: Secondary | ICD-10-CM | POA: Insufficient documentation

## 2015-06-05 DIAGNOSIS — K0889 Other specified disorders of teeth and supporting structures: Secondary | ICD-10-CM | POA: Insufficient documentation

## 2015-06-05 DIAGNOSIS — F329 Major depressive disorder, single episode, unspecified: Secondary | ICD-10-CM | POA: Diagnosis not present

## 2015-06-05 DIAGNOSIS — Z7952 Long term (current) use of systemic steroids: Secondary | ICD-10-CM | POA: Insufficient documentation

## 2015-06-05 DIAGNOSIS — Z8619 Personal history of other infectious and parasitic diseases: Secondary | ICD-10-CM | POA: Diagnosis not present

## 2015-06-05 MED ORDER — IBUPROFEN 800 MG PO TABS
800.0000 mg | ORAL_TABLET | Freq: Once | ORAL | Status: AC
  Administered 2015-06-05: 800 mg via ORAL
  Filled 2015-06-05: qty 1

## 2015-06-05 MED ORDER — ACETAMINOPHEN 500 MG PO TABS: 500.0000 mg | ORAL_TABLET | Freq: Four times a day (QID) | ORAL | Status: AC | PRN

## 2015-06-05 MED ORDER — LIDOCAINE-EPINEPHRINE 2 %-1:100000 IJ SOLN: 1.7000 mL | Freq: Once | INTRAMUSCULAR | Status: DC

## 2015-06-05 MED ORDER — ACETAMINOPHEN 325 MG PO TABS
650.0000 mg | ORAL_TABLET | Freq: Once | ORAL | Status: AC
  Administered 2015-06-05: 650 mg via ORAL
  Filled 2015-06-05: qty 2

## 2015-06-05 MED ORDER — BUPIVACAINE-EPINEPHRINE (PF) 0.5% -1:200000 IJ SOLN
1.8000 mL | Freq: Once | INTRAMUSCULAR | Status: AC
Start: 2015-06-05 — End: 2015-06-05
  Administered 2015-06-05: 1.8 mL
  Filled 2015-06-05: qty 1.8

## 2015-06-05 MED ORDER — CLINDAMYCIN HCL 150 MG PO CAPS: ORAL_CAPSULE | ORAL | Status: AC

## 2015-06-05 MED ORDER — IBUPROFEN 800 MG PO TABS: 800.0000 mg | ORAL_TABLET | Freq: Three times a day (TID) | ORAL | Status: AC

## 2015-06-05 NOTE — ED Provider Notes (Signed)
CSN: [id number]     Arrival date & time 06/05/15  1100 History   First MD Initiated Contact with Patient 06/05/15 1105     Chief Complaint  Patient presents with  . Dental Pain    24 hr hx of r/facial/dental pain  . Facial Swelling     (Consider location/radiation/quality/duration/timing/severity/associated sxs/prior Treatment) Patient is a 35 y.o. female presenting with tooth pain.  Dental Pain Location:  Lower Lower teeth location:  21/LL 1st bicuspid Quality:  Aching and throbbing Severity:  Severe Onset quality:  Gradual Duration:  1 day Timing:  Constant Progression:  Worsening Chronicity:  New Context: dental caries   Context: not trauma   Relieved by:  Nothing Worsened by:  Nothing tried Ineffective treatments: tramadol. Associated symptoms: facial pain   Associated symptoms: no difficulty swallowing, no drooling, no facial swelling, no fever, no neck pain, no neck swelling and no trismus   Risk factors: lack of dental care     Past Medical History  Diagnosis Date  . Depression   . History of chicken pox    Past Surgical History  Procedure Laterality Date  . Tonsillectomy  2008   Family History  Problem Relation Age of Onset  . Hyperlipidemia Mother   . Arthritis Father   . Hyperlipidemia Father   . Hypertension Father   . Hyperlipidemia Paternal Grandmother   . Hypertension Paternal Grandmother   . Hypertension Paternal Grandfather    Social History  Substance Use Topics  . Smoking status: Current Every Day Smoker -- 20 years    Types: Cigarettes  . 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 Alcohol Use: 0.0 oz/week    0 Standard drinks or equivalent per week   OB History    No data available     Review of Systems  Constitutional: Negative for fever and chills.  HENT: Positive for dental problem. Negative for drooling, facial swelling and trouble swallowing.   Respiratory: Negative for  shortness of breath.   Cardiovascular: Negative for chest pain.  Musculoskeletal: Negative for neck pain.  All other systems reviewed and are negative.     Allergies  Amoxil  Home Medications   Prior to Admission medications   Medication Sig Start Date End Date Taking? Authorizing Provider  acetaminophen (TYLENOL) 500 MG tablet Take 1 tablet (500 mg total) by mouth every 6 (six) hours as needed. 06/05/15   Cheri Fowler, PA-C  clindamycin (CLEOCIN) 150 MG capsule Take 3 capsules (450 mg total) PO TID for 7 days. 06/05/15   Cheri Fowler, PA-C  cyclobenzaprine (FLEXERIL) 5 MG tablet 1-2 qhs prn 05/09/15   Pecola Lawless, MD  ibuprofen (ADVIL,MOTRIN) 800 MG tablet Take 1 tablet (800 mg total) by mouth 3 (three) times daily. 06/05/15   Cheri Fowler, PA-C  predniSONE (DELTASONE) 20 MG tablet Take 1 tablet (20 mg total) by mouth 2 (two) times daily. 05/09/15   Pecola Lawless, MD  traMADol (ULTRAM) 50 MG tablet Take 1 tablet (50 mg total) by mouth every 6 (six) hours as needed. 05/09/15   Pecola Lawless, MD   BP 145/96 mmHg  Pulse 99  Temp(Src) 98.3 F (36.8 C) (Oral)  Resp 20  Wt 86.183 kg  SpO2 98%  LMP 05/12/2015 (Approximate) Physical Exam  Constitutional: She is oriented to person, place, and time. She appears well-developed and well-nourished.  HENT:  Head: Normocephalic and atraumatic.  Mouth/Throat: Uvula is midline,  oropharynx is clear and moist and mucous membranes are normal. No trismus in the jaw. Abnormal dentition. Dental caries present.    No tongue swelling or facial swelling.  Airway patent.  Eyes: Conjunctivae are normal.  Neck: Normal range of motion. Neck supple.  No evidence of Ludwigs angina.  Cardiovascular: Normal rate, regular rhythm and normal heart sounds.   Pulmonary/Chest: Effort normal and breath sounds normal.  Abdominal: Bowel sounds are normal. She exhibits no distension.  Musculoskeletal:  Moves all extremities spontaneously.  Lymphadenopathy:    She  has no cervical adenopathy.  Neurological: She is alert and oriented to person, place, and time.  Speech clear without dysarthria.   Skin: Skin is warm and dry.    ED Course  Procedures (including critical care time)  NERVE BLOCK Performed by: Cheri FowlerKayla Mcclain Shall Consent: Verbal consent obtained. Required items: required blood products, implants, devices, and special equipment available Time out: Immediately prior to procedure a "time out" was called to verify the correct patient, procedure, equipment, support staff and site/side marked as required.  Indication: dental pain Nerve block body site: right mandible  Preparation: Patient was prepped and draped in the usual sterile fashion. Needle gauge: 27 G Location technique: anatomical landmarks  Local anesthetic: marcaine w/ epi  Anesthetic total: 1.8 ml  Outcome: pain improved Patient tolerance: Patient tolerated the procedure well with no immediate complications.  Labs Review Labs Reviewed - No data to display  Imaging Review No results found. I have personally reviewed and evaluated these images and lab results as part of my medical decision-making.   EKG Interpretation None      MDM   Final diagnoses:  Pain, dental    Suspect dental pain associated with dental infection and possible dental abscess with patient afebrile, non toxic appearing and swallowing secretions well. Dental block performed.  Patient tolerated the procedure well.  I gave patient referral to dentist and stressed the importance of dental follow up for ultimate management of dental pain. Discussed return precautions.  Patient expresses understanding and agrees with plan.  I will also give penicillin VK and pain control.      Cheri FowlerKayla Kratos Ruscitti, PA-C 06/05/15 1140  Linwood DibblesJon Knapp, MD 06/05/15 [phone]

## 2015-06-05 NOTE — Discharge Instructions (Signed)
Dental Pain  ° ° °Dental pain may be caused by many things, including:  °Tooth decay (cavities or caries). Cavities expose the nerve of your tooth to air and hot or cold temperatures. This can cause pain or discomfort.  °Abscess or infection. A dental abscess is a collection of infected pus from a bacterial infection in the inner part of the tooth (pulp). It usually occurs at the end of the tooth's root.  °Injury.  °An unknown reason (idiopathic). °Your pain may be mild or severe. It may only occur when:  °You are chewing.  °You are exposed to hot or cold temperature.  °You are eating or drinking sugary foods or beverages, such as soda or candy. °Your pain may also be constant.  °HOME CARE INSTRUCTIONS  °Watch your dental pain for any changes. The following actions may help to lessen any discomfort that you are feeling:  °Take medicines only as directed by your dentist.  °If you were prescribed an antibiotic medicine, finish all of it even if you start to feel better.  °Keep all follow-up visits as directed by your dentist. This is important.  °Do not apply heat to the outside of your face.  °Rinse your mouth or gargle with salt water if directed by your dentist. This helps with pain and swelling.  °You can make salt water by adding ¼ tsp of salt to 1 cup of warm water. °Apply ice to the painful area of your face:  °Put ice in a plastic bag.  °Place a towel between your skin and the bag.  °Leave the ice on for 20 minutes, 2-3 times per day. °Avoid foods or drinks that cause you pain, such as:  °Very hot or very cold foods or drinks.  °Sweet or sugary foods or drinks. °SEEK MEDICAL CARE IF:  °Your pain is not controlled with medicines.  °Your symptoms are worse.  °You have new symptoms. °SEEK IMMEDIATE MEDICAL CARE IF:  °You are unable to open your mouth.  °You are having trouble breathing or swallowing.  °You have a fever.  °Your face, neck, or jaw is swollen. °This information is not intended to replace advice  given to you by your health care provider. Make sure you discuss any questions you have with your health care provider.  °Document Released: 05/21/2005 Document Revised: 10/05/2014 Document Reviewed: 05/17/2014  °Elsevier Interactive Patient Education ©2016 Elsevier Inc.  ° ° °Emergency Department Resource Guide °1) Find a Doctor and Pay Out of Pocket °Although you won't have to find out who is covered by your insurance plan, it is a good idea to ask around and get recommendations. You will then need to call the office and see if the doctor you have chosen will accept you as a new patient and what types of options they offer for patients who are self-pay. Some doctors offer discounts or will set up payment plans for their patients who do not have insurance, but you will need to ask so you aren't surprised when you get to your appointment. ° °2) Contact Your Local Health Department °Not all health departments have doctors that can see patients for sick visits, but many do, so it is worth a call to see if yours does. If you don't know where your local health department is, you can check in your phone book. The CDC also has a tool to help you locate your state's health department, and many state websites also have listings of all of their local health departments. ° °  3) Find a Walk-in Clinic °If your illness is not likely to be very severe or complicated, you may want to try a walk in clinic. These are popping up all over the country in pharmacies, drugstores, and shopping centers. They're usually staffed by nurse practitioners or physician assistants that have been trained to treat common illnesses and complaints. They're usually fairly quick and inexpensive. However, if you have serious medical issues or chronic medical problems, these are probably not your best option. ° °No Primary Care Doctor: °- Call [facility] at  832-8000 - they can help you locate a primary care doctor that  accepts your insurance,  provides certain services, etc. °- Physician Referral Service- 1-800-533-3463 ° °Chronic Pain Problems: °Organization         Address  Phone   Notes  °[facility]  (336) 297-2271 Patients need to be referred by their primary care doctor.  ° °Medication Assistance: °Organization         Address  Phone   Notes  °[place] [facility] [address] °[place], [place] [address] (336) 641-8030 --Must be a resident of [place] °-- Must have NO insurance coverage whatsoever (no Medicaid/ Medicare, etc.) °-- The pt. MUST have a primary care doctor that directs their care regularly and follows them in the community °  °[facility]  (866) 331-1348   °[employer]  (888) 892-1162   ° °Agencies that provide inexpensive medical care: °Organization         Address  Phone   Notes  °[facility]  (336) 832-8035   °[facility]    (336) 832-7272   °[facility] [address] °[place], [place] [address] (336) 832-4777   °[facility] [address], °[place] (336) 271-4999   °[facility]    (336) 373-0678   °[facility]    (336) 272-1050   °[facility] ° [address], [place] Phone:  (336) 832-4444, Fax:  (336) 832-4440 Hours of Operation:  9 am - 6 pm, M-F.  Also accepts Medicaid/Medicare and self-pay.  °[facility] ° [address], [place] Phone: (336) 832-3150, Fax: (336) 832-3151. Hours of Operation:  8:30 am - 5:30 pm, M-F.  Also accepts Medicaid and self-pay.  °[facility] [place] [address], [place] Phone: (336) 878-6027   °[facility] [address], [place], [place] (336)723-1848, Ext. 123 Mondays & Thursdays: 7-9 AM.  First 15 patients are seen on a first come, first serve basis. °  ° °Medicaid-accepting [place] Providers: ° °Organization         Address  Phone    Notes  °[facility] [address], [place] (336) 641-2100 Also accepts self-pay patients.  °[facility] [address], [place] ° (336) 856-9996   °[facility] [address], [place] (336) 288-8857   °[facility] [address] [place] [address], [place] (336) 299-7000   °Veita Bland [address], [place]  ° (336) 373-1557 Only accepts [place] Access Medicaid patients after they have their name applied to their card.  ° °Self-Pay (no insurance) in [place]: ° °Organization         Address  Phone   Notes  °Sickle Cell Patients, [facility] [address], [place] (336) 832-1970   °[facility] [address], [place] (336) 832-4400   °[facility] ° [address], [place] (336) 992-4800   °[facility]/Dr. Osei-Bonsu ° [address] [place] [address], [place] or [address], [place] (336) 841-8500 Phone number for both [place] and [place] locations is the same.  °[facility] [address], [place] (336) 299-0000   °[facility] [address] [place] [address], [place] or [address] (336) 852-7530 °(336) 878-2260   °[facility] [address], [place] (336) 350-1642, phone; (336) 294-5005, fax Sees patients 1st and 3rd Saturday of every month.  Must not qualify for public or private insurance (i.e. Medicaid, Medicare, [place] Health Choice, Veterans' Benefits) • Household income should be no more than 200% of the poverty level •The clinic cannot treat you if you are pregnant or think you are pregnant • Sexually transmitted diseases are not treated at the clinic.  ° ° °Dental Care: °Organization         Address  Phone  Notes  °[facility] [address], [place] (336)  641-6152 Accepts children up to age 21 who are enrolled in Medicaid or [place] Health Choice; pregnant women with a Medicaid card; and children who have applied for Medicaid or [place] Health Choice, but were declined, whose parents can pay a reduced fee at time of service.  °[facility] [place]  [address], [place] (336) 641-7733 Accepts children up to age 21 who are enrolled in Medicaid or [place] Health Choice; pregnant women with a Medicaid card; and children who have applied for Medicaid or [place] Health Choice, but were declined, whose parents can pay a reduced fee at time of service.  °[facility] ° [address], [place] (336) 641-4533 Patients are seen by appointment only. Walk-ins are not accepted. [facility] will see patients 18 years of age and older. °Monday - Tuesday (8am-5pm) °Most Wednesdays (8:30-5pm) °$30 per visit, cash only  °[facility] ° [address], [place] (336) 641-4533 Patients are seen by appointment only. Walk-ins are not accepted. [facility] will see patients 18 years of age and older. °One Wednesday Evening (Monthly: Volunteer Based).  $30 per visit, cash only  °[facility]  (919) 537-3737 for adults; Children under age 4, call Graduate Pediatric Dentistry at (919) [phone]. Children aged 4-14, please call (919) [phone] to request a pediatric application. ° Dental services are provided in all areas of dental care including fillings, crowns and bridges, complete and partial dentures, implants, gum treatment, root canals, and extractions. Preventive care is also provided. Treatment is provided to both adults and children. °Patients are selected via a lottery and there is often a waiting list. °  °[facility] [address], °[place] ° (336) 763-8833 [email or web address] °  °[facility] [address], [place], [place] (336)723-1848, Ext.  123 Second and Fourth Thursday of each month, opens at 6:30 AM; Clinic ends at 9 AM.  Patients are seen on a first-come first-served basis, and a limited number are seen during each clinic.  ° °[facility] ° [address], [place], [place] (336) 723-7904   Eligibility Requirements °You must have lived in [place],   [place], or [place] counties for at least the last three months. °  You cannot be eligible for state or federal sponsored healthcare insurance, including Veterans Administration, Medicaid, or Medicare. °  You generally cannot be eligible for healthcare insurance through your employer.  °  How to apply: °Eligibility screenings are held every Tuesday and Wednesday afternoon from 1:00 pm until 4:00 pm. You do not need an appointment for the interview!  °[facility] [address], [place], [place] 336-631-2330   °[facility]  336-342-8273   °[facility]  336-703-3100   °[facility]  336-570-6415   ° °Behavioral Health Resources in the Community: °Intensive Outpatient Programs °Organization         Address  Phone  Notes  °[facility] [address], [place], [place] 336-878-6098   °[facility] [address], [place], [place] 336-832-9800   °[facility] [address], [place], [place] ° 336-882-2125   °[facility] [address],  °[place], [place] 1-800-853-5163 or 336-641-4981   °Substance Abuse Resources °Organization         Address  Phone  Notes  °[facility]  336-882-2125   °[facility]  336-784-9470   °[facility]  336-285-9073   °[facility]  336-845-3988   °Residential & Outpatient Substance Abuse Program  1-800-659-3381   °Psychological Services °Organization         Address  Phone  Notes  °[facility]  336- 832-9600   °[facility]  336- 378-7881   °[place] [address], [place] 1-800-853-5163 or 336-641-4981   ° °Mobile Crisis Teams °Organization         Address  Phone  Notes  °[facility], Mobile Crisis Care Unit  1-877-626-1772   °Assertive °[facility] ° [address] [place], [place] 336-834-9664   °Sharon DeEsch [address] °[place] 336-554-5454   ° °Self-Help/Support Groups °Organization         Address  Phone             Notes  °[facility] - variety of support groups  336- 373-1402 Call for more information  °Narcotics Anonymous (NA), [facility] [address], °[place]  2 meetings at this location  ° °Residential Treatment Programs °Organization         Address  Phone  Notes  °[facility] [address],    °[place]  1-866-801-8205   °[facility] ° [address], [place], [place] 704-293-8524   °[facility] [address], [place] 336-845-3988 Admissions: 8am-3pm M-F  °[facility] [address]    °[place], [place] 336-841-1104   °[facility] [address], [place], [place] 336-379-7146   °[facility] [address]  °[place], [place] 336-285-9073   °[facility] - Intensive Outpatient [address], [place], [place] 336-852-3033   °[facility] [address]  °[place], [place] 1-877-615-2722 or 336-784-9470   °[facility] [address] [place], [place] 336-227-7417 Accepts Medicaid  °[facility] [address]  °[place]  1-800-659-3381 Substance Abuse/Addiction Treatment  ° °[place] Behavioral Health Resources °Organization         Address  Phone  Notes  °[facility]  (888) 581-9988   °Julie Brannon, PhD [address] [place], [place]   (336) 349-5553 or (336) 951-0000   °[facility]   [address] °[place], [place] (336) 349-4454   °[facility] [address], [place], [place] (336) 342-8316 Insurance/Medicaid/sponsorship through [facility]  °[facility] [address]                                    [place], [place] (336) 342-8316 Therapy/tele-psych/case  °[facility] [address]  ° [place], [place] (336) 349-2233    °Dr. Arfeen  (336) 349-4544   °[facility] [place]  [employer] [place] [facility] 1) [address], [place] °2) [address], [place] °3)  [address], [place] (336) 349-3220 °(336) 342-7768 ° °(336) 342-8140   °[place] [facility] (336) 342-1394 or (336) 342-3537 (After Hours)    ° ° ° °

## 2015-06-05 NOTE — ED Notes (Signed)
Pt reports increased pain in r/side of mouth x 24 hours. Pain progressed from aching to throbbing, stabbing pain. Facial swelling noted. Reports hx of broken tooth 1 year ago in same location in mouth

## 2017-07-11 ENCOUNTER — Emergency Department (HOSPITAL_COMMUNITY)
Admission: EM | Admit: 2017-07-11 | Discharge: 2017-07-12 | Disposition: A | Discharge status: Home / Self Care | Payer: 59 | Attending: Emergency Medicine | Admitting: Emergency Medicine

## 2017-07-11 ENCOUNTER — Telehealth: Payer: Self-pay | Admitting: *Deleted

## 2017-07-11 ENCOUNTER — Emergency Department (HOSPITAL_COMMUNITY): Payer: 59

## 2017-07-11 ENCOUNTER — Other Ambulatory Visit: Payer: Self-pay

## 2017-07-11 ENCOUNTER — Ambulatory Visit: Payer: BLUE CROSS/BLUE SHIELD | Admitting: Family Medicine

## 2017-07-11 ENCOUNTER — Encounter (HOSPITAL_COMMUNITY): Payer: Self-pay | Admitting: Emergency Medicine

## 2017-07-11 DIAGNOSIS — R072 Precordial pain: Secondary | ICD-10-CM | POA: Insufficient documentation

## 2017-07-11 DIAGNOSIS — F329 Major depressive disorder, single episode, unspecified: Secondary | ICD-10-CM | POA: Diagnosis not present

## 2017-07-11 DIAGNOSIS — B349 Viral infection, unspecified: Secondary | ICD-10-CM | POA: Insufficient documentation

## 2017-07-11 DIAGNOSIS — F17210 Nicotine dependence, cigarettes, uncomplicated: Secondary | ICD-10-CM | POA: Insufficient documentation

## 2017-07-11 DIAGNOSIS — J40 Bronchitis, not specified as acute or chronic: Secondary | ICD-10-CM | POA: Insufficient documentation

## 2017-07-11 DIAGNOSIS — R55 Syncope and collapse: Secondary | ICD-10-CM | POA: Diagnosis not present

## 2017-07-11 DIAGNOSIS — R42 Dizziness and giddiness: Secondary | ICD-10-CM | POA: Diagnosis present

## 2017-07-11 LAB — I-STAT BETA HCG BLOOD, ED (MC, WL, AP ONLY)

## 2017-07-11 LAB — BASIC METABOLIC PANEL
ANION GAP: 7 (ref 5–15)
BUN: 13 mg/dL (ref 6–20)
CALCIUM: 9.1 mg/dL (ref 8.9–10.3)
CO2: 25 mmol/L (ref 22–32)
Chloride: 107 mmol/L (ref 101–111)
Creatinine, Ser: 0.93 mg/dL (ref 0.44–1.00)
GLUCOSE: 93 mg/dL (ref 65–99)
Potassium: 4.3 mmol/L (ref 3.5–5.1)
SODIUM: 139 mmol/L (ref 135–145)

## 2017-07-11 LAB — CBC
HCT: 41 % (ref 36.0–46.0)
HEMOGLOBIN: 14.6 g/dL (ref 12.0–15.0)
MCH: 31.7 pg (ref 26.0–34.0)
MCHC: 35.6 g/dL (ref 30.0–36.0)
MCV: 89.1 fL (ref 78.0–100.0)
Platelets: 276 10*3/uL (ref 150–400)
RBC: 4.6 MIL/uL (ref 3.87–5.11)
RDW: 12.7 % (ref 11.5–15.5)
WBC: 8.3 10*3/uL (ref 4.0–10.5)

## 2017-07-11 LAB — URINALYSIS, ROUTINE W REFLEX MICROSCOPIC
BILIRUBIN URINE: NEGATIVE
Glucose, UA: NEGATIVE mg/dL
Hgb urine dipstick: NEGATIVE
KETONES UR: NEGATIVE mg/dL
Leukocytes, UA: NEGATIVE
NITRITE: NEGATIVE
Protein, ur: NEGATIVE mg/dL
SPECIFIC GRAVITY, URINE: 1.023 (ref 1.005–1.030)
pH: 5 (ref 5.0–8.0)

## 2017-07-11 LAB — TROPONIN I: Troponin I: <0.03 ng/mL (ref <0.03)

## 2017-07-11 LAB — CBG MONITORING, ED: GLUCOSE-CAPILLARY: 82 mg/dL (ref 65–99)

## 2017-07-11 MED ORDER — DM-GUAIFENESIN ER 30-600 MG PO TB12: 1.0000 | ORAL_TABLET | Freq: Two times a day (BID) | ORAL | 1 refills | Status: AC

## 2017-07-11 MED ORDER — PROMETHAZINE HCL 25 MG PO TABS: 25.0000 mg | ORAL_TABLET | Freq: Four times a day (QID) | ORAL | 1 refills | Status: AC | PRN

## 2017-07-11 MED ORDER — ALBUTEROL SULFATE HFA 108 (90 BASE) MCG/ACT IN AERS: 2.0000 | INHALATION_SPRAY | Freq: Four times a day (QID) | RESPIRATORY_TRACT | 0 refills | Status: AC | PRN

## 2017-07-11 NOTE — Telephone Encounter (Signed)
Patient did not show for her visit today and it appears she is now at the ED.

## 2017-07-11 NOTE — Discharge Instructions (Signed)
Workup for the chest pain without any acute findings.  Chest x-ray negative.  Cardiac marker negative.  The constellation of symptoms may very well be some type of viral illness.  With some nausea and some vomiting and fatigue and some bronchitis and cough and productive cough.  Basic labs without any significant abnormalities.  Use the albuterol inhaler for the next 7 days.  Take the Phenergan as needed for nausea and vomiting.  Mucinex DM over-the-counter as needed for the cough and congestion.  Return for any new or worse symptoms.

## 2017-07-11 NOTE — Telephone Encounter (Signed)
Called patient regarding visit that is scheduled for today at 1:00 with Dr Jimmey RalphParker. She states on 07/09/17 she experienced a loss of consciousness and vomited 3 times. She states that since then she has been lethargic, nauseous and dizzy. States the only medications she takes is Aleve. I updated Dr Jimmey RalphParker with this information and he recommends she go to the ED for further workup. I called patient back and explained this to her. She states she will check with her spouse and her insurance regarding going to ED. She states she will call to cancel her appointment if she goes to the ED. I explained to her that if she comes in for her office visit there is still a chance that Dr Jimmey RalphParker will send her to the ED. She stated understanding.

## 2017-07-11 NOTE — ED Notes (Signed)
Upon speaking with patient, patient endorses central chest pain that radiated to her neck and shortness of breath before her syncopal episode.

## 2017-07-11 NOTE — ED Triage Notes (Signed)
Pt verbalizing episode of dizziness and emesis while going to smoke at 2300 yesterday; pt verbalizes fatigue at present time.

## 2017-07-11 NOTE — ED Provider Notes (Signed)
[facility]-EMERGENCY DEPT Provider Note   CSN: [id number] Arrival date & time: 07/11/17  1129     History   Chief Complaint Chief Complaint  Patient presents with  . Near Syncope    HPI Cheryl Soto is a 37 y.o. female.  Patient presenting with a constellation of symptoms.  Symptoms started 2 days ago.  Patient had an episode of dizziness and vomiting and felt like she was going to pass out.  Patient also endorses some substernal chest pain that radiated to her neck and some shortness of breath.  Patient's had some productive cough but she is a heavy smoker a little heavier than usual.  Patient also had some episodes of nausea and vomiting.  No lower extremity swelling.  No sore throat.  No diarrhea.  Main concern has been the near syncope and the chest discomfort.      Past Medical History:  Diagnosis Date  . Depression   . History of chicken pox     Patient Active Problem List   Diagnosis Date Noted  . Tachycardia 12/31/2014  . Tobacco use disorder 06/16/2013  . Depression     Past Surgical History:  Procedure Laterality Date  . TONSILLECTOMY  2008    OB History    No data available       Home Medications    Prior to Admission medications   Medication Sig Start Date End Date Taking? Authorizing Provider  naproxen sodium (ALEVE) 220 MG tablet Take 220 mg by mouth daily as needed (pain).   Yes [provider]  acetaminophen (TYLENOL) 500 MG tablet Take 1 tablet (500 mg total) by mouth every 6 (six) hours as needed. Patient not taking: Reported on 07/11/2017 06/05/15   Cheri Fowler, PA-C  albuterol (PROVENTIL HFA;VENTOLIN HFA) 108 [phone] Base) MCG/ACT inhaler Inhale 2 puffs into the lungs every 6 (six) hours as needed for wheezing or shortness of breath. 07/11/17   Vanetta Mulders, MD  clindamycin (CLEOCIN) 150 MG capsule Take 3 capsules (450 mg total) PO TID for 7 days. Patient not taking: Reported on 07/11/2017 06/05/15   Cheri Fowler, PA-C   cyclobenzaprine (FLEXERIL) 5 MG tablet 1-2 qhs prn Patient not taking: Reported on 07/11/2017 05/09/15   Pecola Lawless, MD  dextromethorphan-guaiFENesin [facility] DM) 30-600 MG 12hr tablet Take 1 tablet by mouth 2 (two) times daily. 07/11/17   Vanetta Mulders, MD  ibuprofen (ADVIL,MOTRIN) 800 MG tablet Take 1 tablet (800 mg total) by mouth 3 (three) times daily. Patient not taking: Reported on 07/11/2017 06/05/15   Cheri Fowler, PA-C  predniSONE (DELTASONE) 20 MG tablet Take 1 tablet (20 mg total) by mouth 2 (two) times daily. Patient not taking: Reported on 07/11/2017 05/09/15   Pecola Lawless, MD  promethazine (PHENERGAN) 25 MG tablet Take 1 tablet (25 mg total) by mouth every 6 (six) hours as needed for nausea or vomiting. 07/11/17   Vanetta Mulders, MD  traMADol (ULTRAM) 50 MG tablet Take 1 tablet (50 mg total) by mouth every 6 (six) hours as needed. Patient not taking: Reported on 07/11/2017 05/09/15   Pecola Lawless, MD    Family History Family History  Problem Relation Age of Onset  . Hyperlipidemia Mother   . Arthritis Father   . Hyperlipidemia Father   . Hypertension Father   . Hyperlipidemia Paternal Grandmother   . Hypertension Paternal Grandmother   . Hypertension Paternal Grandfather     Social History Social History   Tobacco Use  .  Smoking status: Current Every Day Smoker    Years: 20.00    Types: Cigarettes  . Tobacco comment: lives with wife - married 2014, works in Doctor, general practice  Substance Use Topics  . Alcohol use: Yes    Alcohol/week: 0.0 oz  . Drug use: No     Allergies   Amoxil [amoxicillin]   Review of Systems Review of Systems  Constitutional: Positive for fatigue. Negative for fever.  HENT: Negative for congestion and sore throat.   Eyes: Negative for visual disturbance.  Respiratory: Positive for shortness of breath.   Cardiovascular: Negative for chest pain.  Gastrointestinal: Positive for nausea and vomiting. Negative for abdominal pain and  diarrhea.  Genitourinary: Negative for dysuria.  Musculoskeletal: Negative for back pain.  Skin: Negative for rash.  Neurological: Negative for dizziness, syncope and headaches.  Hematological: Does not bruise/bleed easily.  Psychiatric/Behavioral: Negative for confusion.     Physical Exam Updated Vital Signs BP 111/83 (BP Location: Right Arm)   Pulse 71   Temp 98.3 F (36.8 C) (Oral)   Resp 18   Ht 1.676 m (5\' 6" )   Wt 81.6 kg (180 lb)   SpO2 99%   BMI 29.05 kg/m   Physical Exam  Constitutional: She is oriented to person, place, and time. She appears well-developed and well-nourished. No distress.  HENT:  Head: Normocephalic and atraumatic.  Mouth/Throat: Oropharynx is clear and moist.  Eyes: Conjunctivae and EOM are normal. Pupils are equal, round, and reactive to light.  Neck: Normal range of motion. Neck supple.  Cardiovascular: Normal rate, regular rhythm and normal heart sounds.  Pulmonary/Chest: Effort normal and breath sounds normal. No respiratory distress.  Abdominal: Soft. Bowel sounds are normal. There is no tenderness.  Musculoskeletal: Normal range of motion. She exhibits no edema.  Neurological: She is alert and oriented to person, place, and time. No cranial nerve deficit or sensory deficit. She exhibits normal muscle tone. Coordination normal.  Skin: Skin is warm. Capillary refill takes less than 2 seconds. No rash noted.  Nursing note and vitals reviewed.    ED Treatments / Results  Labs (all labs ordered are listed, but only abnormal results are displayed) Labs Reviewed  BASIC METABOLIC PANEL  CBC  URINALYSIS, ROUTINE W REFLEX MICROSCOPIC  TROPONIN I  CBG MONITORING, ED  I-STAT BETA HCG BLOOD, ED ([facility], [facility], [facility] ONLY)    EKG  EKG Interpretation  Date/Time:  Thursday July 11 2017 11:52:04 EST Ventricular Rate:  80 PR Interval:    QRS Duration: 89 QT Interval:  366 QTC Calculation: 423 R Axis:   83 Text Interpretation:  Sinus rhythm No  old tracing to compare Confirmed by Rolan Bucco [id number]) on 07/11/2017 11:57:50 AM Also confirmed by Vanetta Mulders [id number]  on 07/11/2017 9:36:33 PM       Radiology Dg Chest 2 View  Result Date: 07/11/2017 CLINICAL DATA:  Near syncope.  Chest pain. EXAM: CHEST  2 VIEW COMPARISON:  None. FINDINGS: The heart size and mediastinal contours are within normal limits. Both lungs are clear. The visualized skeletal structures are unremarkable. IMPRESSION: No active cardiopulmonary disease. Electronically Signed   By: Gerome Sam III M.D   On: 07/11/2017 23:14    Procedures Procedures (including critical care time)  Medications Ordered in ED Medications - No data to display   Initial Impression / Assessment and Plan / ED Course  I have reviewed the triage vital signs and the nursing notes.  Pertinent labs & imaging results that were available during  my care of the patient were reviewed by me and considered in my medical decision making (see chart for details).     Patient nontoxic no acute distress.  Oxygen saturation 100% on room air.  Not tachycardic not febrile.  Workup for the chest pain chest x-ray negative EKG without acute changes and troponin negative.  Cardiac monitoring without arrhythmia.  Patient is a smoker and there is a component of bronchitis.  Part of this could also be viral illness in nature.  Does not seem to be truly flulike.  Not concerned about pulmonary embolus.  No leg swelling as stated not hypoxic not tachycardic.  No wheezing.  Patient did have some episodes of nausea and vomiting.  Will treat with albuterol inhaler Mucinex DM and Phenergan.  Final Clinical Impressions(s) / ED Diagnoses   Final diagnoses:  Near syncope  Precordial pain  Viral illness  Bronchitis    ED Discharge Orders        Ordered    albuterol (PROVENTIL HFA;VENTOLIN HFA) 108 (90 Base) MCG/ACT inhaler  Every 6 hours PRN     07/11/17 2337    promethazine (PHENERGAN) 25 MG tablet   Every 6 hours PRN     07/11/17 2337    dextromethorphan-guaiFENesin (MUCINEX DM) 30-600 MG 12hr tablet  2 times daily     07/11/17 2337       Vanetta MuldersZackowski, Joab Carden, MD 07/11/17 2344

## 2017-10-23 ENCOUNTER — Emergency Department (HOSPITAL_COMMUNITY): Payer: 59

## 2017-10-23 ENCOUNTER — Other Ambulatory Visit: Payer: Self-pay

## 2017-10-23 ENCOUNTER — Emergency Department (HOSPITAL_COMMUNITY)
Admission: EM | Admit: 2017-10-23 | Discharge: 2017-10-24 | Disposition: A | Discharge status: Home / Self Care | Payer: 59 | Attending: Emergency Medicine | Admitting: Emergency Medicine

## 2017-10-23 ENCOUNTER — Encounter (HOSPITAL_COMMUNITY): Payer: Self-pay | Admitting: Emergency Medicine

## 2017-10-23 DIAGNOSIS — S61451A Open bite of right hand, initial encounter: Secondary | ICD-10-CM | POA: Diagnosis present

## 2017-10-23 DIAGNOSIS — S51051A Open bite, right elbow, initial encounter: Secondary | ICD-10-CM | POA: Diagnosis not present

## 2017-10-23 DIAGNOSIS — Y999 Unspecified external cause status: Secondary | ICD-10-CM | POA: Insufficient documentation

## 2017-10-23 DIAGNOSIS — Y929 Unspecified place or not applicable: Secondary | ICD-10-CM | POA: Insufficient documentation

## 2017-10-23 DIAGNOSIS — Y939 Activity, unspecified: Secondary | ICD-10-CM | POA: Insufficient documentation

## 2017-10-23 DIAGNOSIS — W540XXA Bitten by dog, initial encounter: Secondary | ICD-10-CM | POA: Insufficient documentation

## 2017-10-23 LAB — BASIC METABOLIC PANEL
Anion gap: 8 (ref 5–15)
BUN: 15 mg/dL (ref 6–20)
CO2: 23 mmol/L (ref 22–32)
Calcium: 8.5 mg/dL — ABNORMAL LOW (ref 8.9–10.3)
Chloride: 111 mmol/L (ref 101–111)
Creatinine, Ser: 0.92 mg/dL (ref 0.44–1.00)
GFR calc Af Amer: >60 mL/min (ref >60)
GFR calc non Af Amer: >60 mL/min (ref >60)
Glucose, Bld: 93 mg/dL (ref 65–99)
Potassium: 4.1 mmol/L (ref 3.5–5.1)
Sodium: 142 mmol/L (ref 135–145)

## 2017-10-23 LAB — CBC WITH DIFFERENTIAL/PLATELET
Basophils Absolute: 0.1 10*3/uL (ref 0.0–0.1)
Basophils Relative: 1 %
Eosinophils Absolute: 0.4 10*3/uL (ref 0.0–0.7)
Eosinophils Relative: 4 %
HCT: 34.1 % — ABNORMAL LOW (ref 36.0–46.0)
Hemoglobin: 11.6 g/dL — ABNORMAL LOW (ref 12.0–15.0)
Lymphocytes Relative: 28 %
Lymphs Abs: 2.6 10*3/uL (ref 0.7–4.0)
MCH: 31.4 pg (ref 26.0–34.0)
MCHC: 34 g/dL (ref 30.0–36.0)
MCV: 92.2 fL (ref 78.0–100.0)
Monocytes Absolute: 0.9 10*3/uL (ref 0.1–1.0)
Monocytes Relative: 9 %
Neutro Abs: 5.4 10*3/uL (ref 1.7–7.7)
Neutrophils Relative %: 58 %
Platelets: 254 10*3/uL (ref 150–400)
RBC: 3.7 MIL/uL — ABNORMAL LOW (ref 3.87–5.11)
RDW: 13.4 % (ref 11.5–15.5)
WBC: 9.3 10*3/uL (ref 4.0–10.5)

## 2017-10-23 MED ORDER — SODIUM CHLORIDE 0.9 % IV SOLN
3.0000 g | Freq: Once | INTRAVENOUS | Status: AC
  Administered 2017-10-23: 3 g via INTRAVENOUS
  Filled 2017-10-23: qty 3

## 2017-10-23 MED ORDER — MORPHINE SULFATE (PF) 4 MG/ML IV SOLN
4.0000 mg | Freq: Once | INTRAVENOUS | Status: AC
  Administered 2017-10-23: 4 mg via INTRAVENOUS
  Filled 2017-10-23: qty 1

## 2017-10-23 MED ORDER — OXYCODONE-ACETAMINOPHEN 5-325 MG PO TABS: 1.0000 | ORAL_TABLET | Freq: Four times a day (QID) | ORAL | 0 refills | Status: AC | PRN

## 2017-10-23 MED ORDER — ONDANSETRON HCL 4 MG/2ML IJ SOLN
4.0000 mg | Freq: Once | INTRAMUSCULAR | Status: AC
  Administered 2017-10-23: 4 mg via INTRAVENOUS
  Filled 2017-10-23: qty 2

## 2017-10-23 MED ORDER — CLINDAMYCIN PHOSPHATE 900 MG/50ML IV SOLN
900.0000 mg | Freq: Once | INTRAVENOUS | Status: DC
  Filled 2017-10-23: qty 50

## 2017-10-23 MED ORDER — AMOXICILLIN-POT CLAVULANATE 875-125 MG PO TABS: 1.0000 | ORAL_TABLET | Freq: Two times a day (BID) | ORAL | 0 refills | Status: AC

## 2017-10-23 NOTE — Discharge Instructions (Signed)
Go to Dr. Bari Edward office at 845.  Return here as needed.  Keep the finger in hand elevated.

## 2017-10-23 NOTE — ED Provider Notes (Signed)
[facility]-EMERGENCY DEPT Provider Note   CSN: [id number] Arrival date & time: 10/23/17  1958     History   Chief Complaint Chief Complaint  Patient presents with  . Animal Bite    HPI Cheryl Soto is a 37 y.o. female.  HPI Patient presents to the emergency department with dog bite to the right thumb and right elbow.  Patient states the area became more painful and swollen over the last few days.  The patient states her right thumb is very swollen and too tender and painful to move.  Patient states that her hand is also swelling but she has full range of motion of the rest of her digits.  Patient denies fever, nausea, vomiting, chest pain, shortness of breath, weakness, dizziness, near syncope or syncope. Past Medical History:  Diagnosis Date  . Depression   . History of chicken pox     Patient Active Problem List   Diagnosis Date Noted  . Tachycardia 12/31/2014  . Tobacco use disorder 06/16/2013  . Depression     Past Surgical History:  Procedure Laterality Date  . TONSILLECTOMY  2008     OB History   None      Home Medications    Prior to Admission medications   Medication Sig Start Date End Date Taking? Authorizing Provider  ibuprofen (ADVIL,MOTRIN) 200 MG tablet Take 400 mg by mouth every 6 (six) hours as needed (hand pain.).   Yes [provider]  acetaminophen (TYLENOL) 500 MG tablet Take 1 tablet (500 mg total) by mouth every 6 (six) hours as needed. Patient not taking: Reported on 07/11/2017 06/05/15   Cheri Fowler, PA-C  albuterol (PROVENTIL HFA;VENTOLIN HFA) 108 [phone] Base) MCG/ACT inhaler Inhale 2 puffs into the lungs every 6 (six) hours as needed for wheezing or shortness of breath. Patient not taking: Reported on 10/23/2017 07/11/17   Vanetta Mulders, MD  clindamycin (CLEOCIN) 150 MG capsule Take 3 capsules (450 mg total) PO TID for 7 days. Patient not taking: Reported on 07/11/2017 06/05/15   Cheri Fowler, PA-C  cyclobenzaprine  (FLEXERIL) 5 MG tablet 1-2 qhs prn Patient not taking: Reported on 07/11/2017 05/09/15   Pecola Lawless, MD  dextromethorphan-guaiFENesin [facility] DM) 30-600 MG 12hr tablet Take 1 tablet by mouth 2 (two) times daily. 07/11/17   Vanetta Mulders, MD  ibuprofen (ADVIL,MOTRIN) 800 MG tablet Take 1 tablet (800 mg total) by mouth 3 (three) times daily. Patient not taking: Reported on 07/11/2017 06/05/15   Cheri Fowler, PA-C  predniSONE (DELTASONE) 20 MG tablet Take 1 tablet (20 mg total) by mouth 2 (two) times daily. Patient not taking: Reported on 07/11/2017 05/09/15   Pecola Lawless, MD  promethazine (PHENERGAN) 25 MG tablet Take 1 tablet (25 mg total) by mouth every 6 (six) hours as needed for nausea or vomiting. Patient not taking: Reported on 10/23/2017 07/11/17   Vanetta Mulders, MD  traMADol (ULTRAM) 50 MG tablet Take 1 tablet (50 mg total) by mouth every 6 (six) hours as needed. Patient not taking: Reported on 07/11/2017 05/09/15   Pecola Lawless, MD    Family History Family History  Problem Relation Age of Onset  . Hyperlipidemia Mother   . Arthritis Father   . Hyperlipidemia Father   . Hypertension Father   . Hyperlipidemia Paternal Grandmother   . Hypertension Paternal Grandmother   . Hypertension Paternal Grandfather     Social History Social History   Tobacco Use  . Smoking status: Current Every  Day Smoker    Years: 20.00    Types: Cigarettes  . Smokeless tobacco: Never Used  . Tobacco comment: lives with wife - married 2014, works in Doctor, general practice  Substance Use Topics  . Alcohol use: Yes    Alcohol/week: 0.0 oz  . Drug use: No     Allergies   Patient has no active allergies.   Review of Systems Review of Systems All other systems negative except as documented in the HPI. All pertinent positives and negatives as reviewed in the HPI.  Physical Exam Updated Vital Signs BP 139/82 (BP Location: Left Arm)   Pulse 84   Temp 98.4 F (36.9 C) (Oral)   Resp 18   SpO2  100%   Physical Exam  Constitutional: She is oriented to person, place, and time. She appears well-developed and well-nourished. No distress.  HENT:  Head: Normocephalic and atraumatic.  Eyes: Pupils are equal, round, and reactive to light.  Pulmonary/Chest: Effort normal.  Musculoskeletal:       Right hand: She exhibits decreased range of motion, tenderness and swelling. Normal sensation noted.       Hands: Neurological: She is alert and oriented to person, place, and time.  Skin: Skin is warm and dry. There is erythema.  Psychiatric: She has a normal mood and affect.  Nursing note and vitals reviewed.    ED Treatments / Results  Labs (all labs ordered are listed, but only abnormal results are displayed) Labs Reviewed  BASIC METABOLIC PANEL - Abnormal; Notable for the following components:      Result Value   Calcium 8.5 (*)    All other components within normal limits  CBC WITH DIFFERENTIAL/PLATELET - Abnormal; Notable for the following components:   RBC 3.70 (*)    Hemoglobin 11.6 (*)    HCT 34.1 (*)    All other components within normal limits    EKG None  Radiology Dg Finger Thumb Right  Result Date: 10/23/2017 CLINICAL DATA:  Puncture wound with swelling EXAM: RIGHT THUMB 2+V COMPARISON:  None. FINDINGS: There is no evidence of fracture or dislocation. There is no evidence of arthropathy or other focal bone abnormality. No foreign body or soft tissue gas. IMPRESSION: No acute osseous abnormality Electronically Signed   By: Jasmine Pang M.D.   On: 10/23/2017 22:17    Procedures Procedures (including critical care time)  Medications Ordered in ED Medications  Ampicillin-Sulbactam (UNASYN) 3 g in sodium chloride 0.9 % 100 mL IVPB (0 g Intravenous Stopped 10/23/17 2315)  morphine 4 MG/ML injection 4 mg (4 mg Intravenous Given 10/23/17 2316)  ondansetron (ZOFRAN) injection 4 mg (4 mg Intravenous Given 10/23/17 2316)     Initial Impression / Assessment and Plan / ED  Course  I have reviewed the triage vital signs and the nursing notes.  Pertinent labs & imaging results that were available during my care of the patient were reviewed by me and considered in my medical decision making (see chart for details).     Spoke with Dr. Orlan Leavens of hand surgery who will have the patient come to his office tomorrow morning at 9 AM.  I have advised the patient of the plan.  She was given Unasyn here in the emergency department will be discharged on Augmentin.  I feel the patient most likely will need surgical intervention to help facilitate proper healing of this infectious process of the dog bite.  The patient's tetanus shot is up-to-date.  Final Clinical Impressions(s) / ED Diagnoses  Final diagnoses:  None    ED Discharge Orders    None       Charlestine Night, Cordelia Poche 10/23/17 2349    Benjiman Core, MD 10/24/17 [phone]

## 2017-10-23 NOTE — ED Triage Notes (Signed)
Pt states that she was bit by her neighbor's brother's dog on Monday.  Pt did not seek medical care at that point.  Bite marks noted to rt thumb and rt elbow.  Swelling noted to elbow and to hand.  Denies fever.  Rabies status on dog who bit her is unknown.  Dog currently in quarantine at [facility].

## 2018-06-11 IMAGING — CR DG CHEST 2V
2 series · 2 of 2 positions shown · non-contrast
Comparison: None.

CLINICAL DATA: Near syncope.  Chest pain.

EXAM:
CHEST  2 VIEW

[w chest lat]
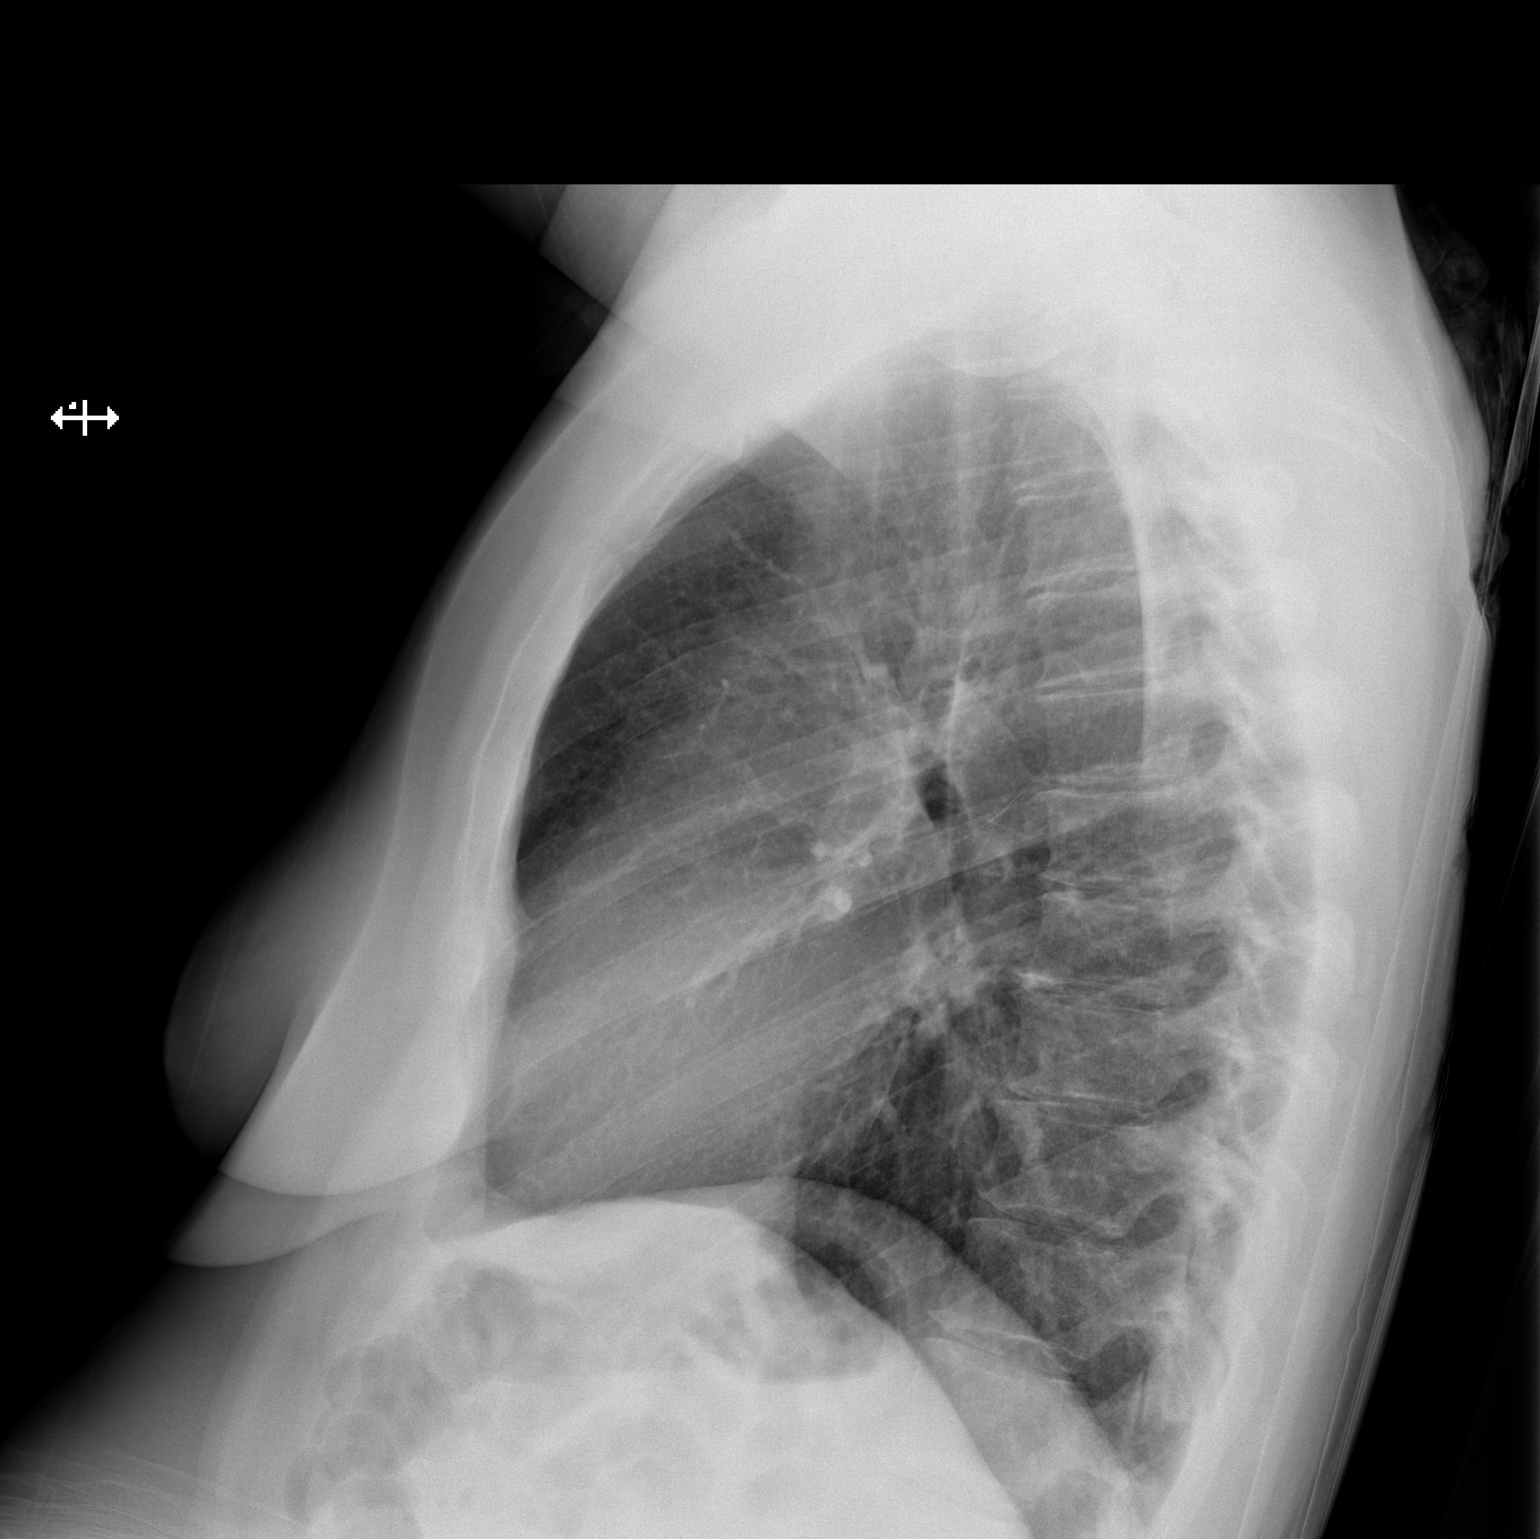

[x chest ap]
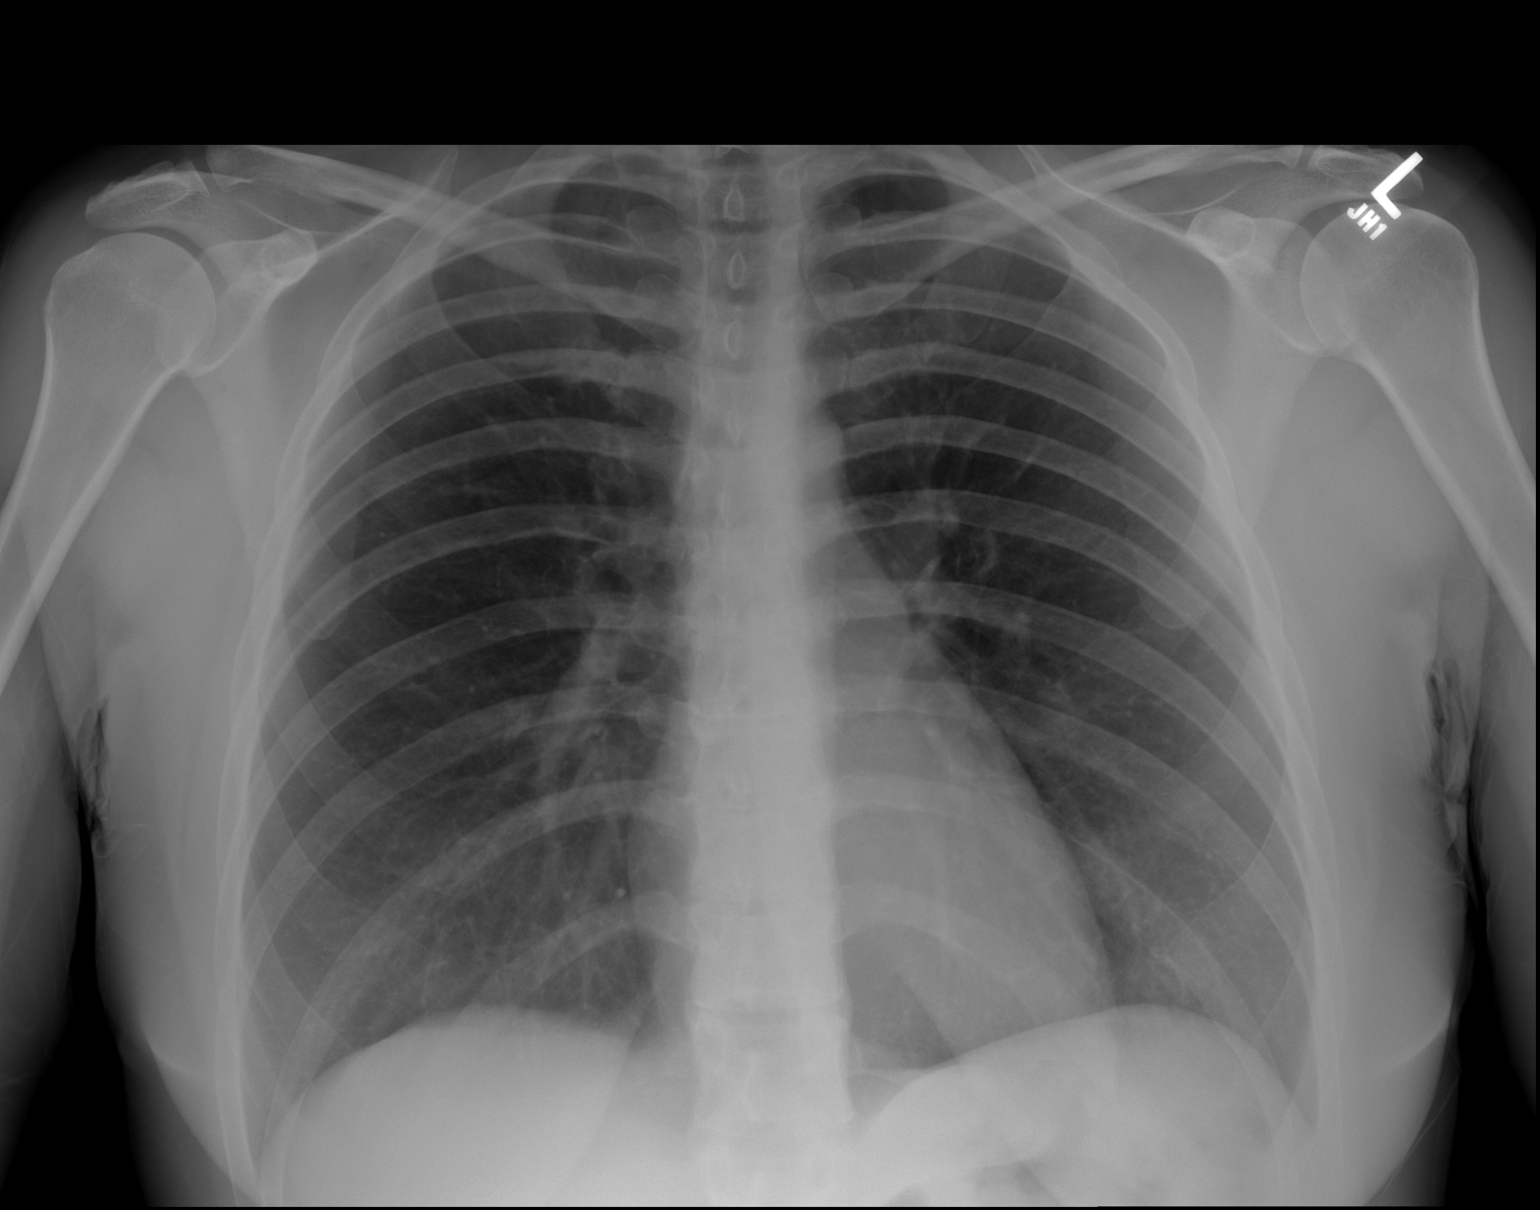

[2 of 2 positions shown; findings below may reference images not displayed]

FINDINGS: The heart size and mediastinal contours are within normal limits.
Both lungs are clear. The visualized skeletal structures are
unremarkable.
IMPRESSION: No active cardiopulmonary disease.

## 2021-02-15 ENCOUNTER — Ambulatory Visit: Payer: 59 | Admitting: Nurse Practitioner

## 2022-01-10 ENCOUNTER — Emergency Department (HOSPITAL_BASED_OUTPATIENT_CLINIC_OR_DEPARTMENT_OTHER)
Admission: EM | Admit: 2022-01-10 | Discharge: 2022-01-10 | Disposition: A | Discharge status: Home / Self Care | Payer: BC Managed Care – PPO | Attending: Emergency Medicine | Admitting: Emergency Medicine

## 2022-01-10 ENCOUNTER — Other Ambulatory Visit: Payer: Self-pay

## 2022-01-10 ENCOUNTER — Emergency Department (HOSPITAL_BASED_OUTPATIENT_CLINIC_OR_DEPARTMENT_OTHER): Payer: BC Managed Care – PPO

## 2022-01-10 DIAGNOSIS — R42 Dizziness and giddiness: Secondary | ICD-10-CM | POA: Diagnosis not present

## 2022-01-10 DIAGNOSIS — R0602 Shortness of breath: Secondary | ICD-10-CM | POA: Diagnosis not present

## 2022-01-10 DIAGNOSIS — F17200 Nicotine dependence, unspecified, uncomplicated: Secondary | ICD-10-CM | POA: Diagnosis not present

## 2022-01-10 DIAGNOSIS — R079 Chest pain, unspecified: Secondary | ICD-10-CM | POA: Diagnosis present

## 2022-01-10 LAB — BASIC METABOLIC PANEL
Anion gap: 6 (ref 5–15)
BUN: 12 mg/dL (ref 6–20)
CO2: 22 mmol/L (ref 22–32)
Calcium: 9.3 mg/dL (ref 8.9–10.3)
Chloride: 109 mmol/L (ref 98–111)
Creatinine, Ser: 0.77 mg/dL (ref 0.44–1.00)
GFR, Estimated: >60 mL/min (ref >60)
Glucose, Bld: 92 mg/dL (ref 70–99)
Potassium: 3.8 mmol/L (ref 3.5–5.1)
Sodium: 137 mmol/L (ref 135–145)

## 2022-01-10 LAB — PREGNANCY, URINE: Preg Test, Ur: NEGATIVE

## 2022-01-10 LAB — CBC
HCT: 39.2 % (ref 36.0–46.0)
Hemoglobin: 14 g/dL (ref 12.0–15.0)
MCH: 32.1 pg (ref 26.0–34.0)
MCHC: 35.7 g/dL (ref 30.0–36.0)
MCV: 89.9 fL (ref 80.0–100.0)
Platelets: 260 10*3/uL (ref 150–400)
RBC: 4.36 MIL/uL (ref 3.87–5.11)
RDW: 12.4 % (ref 11.5–15.5)
WBC: 10.8 10*3/uL — ABNORMAL HIGH (ref 4.0–10.5)
nRBC: 0 % (ref 0.0–0.2)

## 2022-01-10 LAB — TROPONIN I (HIGH SENSITIVITY): Troponin I (High Sensitivity): <2 ng/L (ref <18)

## 2022-01-10 NOTE — ED Provider Notes (Cosign Needed Addendum)
[facility] EMERGENCY DEPARTMENT Provider Note   CSN: [id number] Arrival date & time: 01/10/22  1019     History  Chief Complaint  Patient presents with   Chest Pain    Cheryl Soto is a 41 y.o. female.  HPI 41 year old female with no significant medical history presents to the ER from urgent care with complaints of chest pain, shortness of breath and dizziness which has been ongoing for the last several days.  She also reported recent hypotension, took her blood pressure at home and it was 80/60.  She denies any cough, fevers, chills, abdominal pain, dysuria.  Denies any hematemesis or hematochezia.  No hemoptysis.  No history of PE or DVT.  She states that she has been going through a lot at home and thinks this may be contributing.  He also states that she is a smoker.  She states that she does have shortness of breath on exertion but I can see that this was documented even back in 2016 from an office visit.  She has no other cardiac problems in the past.  She has been eating and drinking well. At present she feels normal and has no chest pain.  She does have a history of depression per chart review.    Home Medications Prior to Admission medications   Medication Sig Start Date End Date Taking? Authorizing Provider  acetaminophen (TYLENOL) 500 MG tablet Take 1 tablet (500 mg total) by mouth every 6 (six) hours as needed. Patient not taking: Reported on 07/11/2017 06/05/15   Cheri Fowler, PA-C  albuterol (PROVENTIL HFA;VENTOLIN HFA) 108 [phone] Base) MCG/ACT inhaler Inhale 2 puffs into the lungs every 6 (six) hours as needed for wheezing or shortness of breath. Patient not taking: Reported on 10/23/2017 07/11/17   Vanetta Mulders, MD  amoxicillin-clavulanate (AUGMENTIN) 875-125 MG tablet Take 1 tablet by mouth every 12 (twelve) hours. 10/23/17   Lawyer, Cristal Deer, PA-C  clindamycin (CLEOCIN) 150 MG capsule Take 3 capsules (450 mg total) PO TID for 7 days. Patient not taking:  Reported on 07/11/2017 06/05/15   Cheri Fowler, PA-C  cyclobenzaprine (FLEXERIL) 5 MG tablet 1-2 qhs prn Patient not taking: Reported on 07/11/2017 05/09/15   Pecola Lawless, MD  dextromethorphan-guaiFENesin [facility] DM) 30-600 MG 12hr tablet Take 1 tablet by mouth 2 (two) times daily. 07/11/17   Vanetta Mulders, MD  ibuprofen (ADVIL,MOTRIN) 200 MG tablet Take 400 mg by mouth every 6 (six) hours as needed (hand pain.).    [provider]  ibuprofen (ADVIL,MOTRIN) 800 MG tablet Take 1 tablet (800 mg total) by mouth 3 (three) times daily. Patient not taking: Reported on 07/11/2017 06/05/15   Cheri Fowler, PA-C  oxyCODONE-acetaminophen (PERCOCET/ROXICET) 5-325 MG tablet Take 1 tablet by mouth every 6 (six) hours as needed for severe pain. 10/23/17   Lawyer, Cristal Deer, PA-C  predniSONE (DELTASONE) 20 MG tablet Take 1 tablet (20 mg total) by mouth 2 (two) times daily. Patient not taking: Reported on 07/11/2017 05/09/15   Pecola Lawless, MD  promethazine (PHENERGAN) 25 MG tablet Take 1 tablet (25 mg total) by mouth every 6 (six) hours as needed for nausea or vomiting. Patient not taking: Reported on 10/23/2017 07/11/17   Vanetta Mulders, MD  traMADol (ULTRAM) 50 MG tablet Take 1 tablet (50 mg total) by mouth every 6 (six) hours as needed. Patient not taking: Reported on 07/11/2017 05/09/15   Pecola Lawless, MD      Allergies    Amoxil [amoxicillin]  Review of Systems   Review of Systems Ten systems reviewed and are negative for acute change, except as noted in the HPI.   Physical Exam Updated Vital Signs BP 122/84 (BP Location: Left Arm)   Pulse 72   Temp 98.7 F (37.1 C) (Oral)   Resp 18   Ht 5\' 6"  (1.676 m)   Wt 79.4 kg   LMP 12/20/2021   SpO2 99%   BMI 28.25 kg/m  Physical Exam Vitals and nursing note reviewed.  Constitutional:      General: She is not in acute distress.    Appearance: She is well-developed.  HENT:     Head: Normocephalic and atraumatic.  Eyes:      Conjunctiva/sclera: Conjunctivae normal.  Cardiovascular:     Rate and Rhythm: Normal rate and regular rhythm.     Heart sounds: No murmur heard. Pulmonary:     Effort: Pulmonary effort is normal. No respiratory distress.     Breath sounds: Normal breath sounds.  Chest:     Comments: No chest wall tenderness Abdominal:     Palpations: Abdomen is soft.     Tenderness: There is no abdominal tenderness.  Musculoskeletal:        General: No swelling.     Cervical back: Neck supple.     Right lower leg: No edema.     Left lower leg: No edema.  Skin:    General: Skin is warm and dry.     Capillary Refill: Capillary refill takes less than 2 seconds.  Neurological:     Mental Status: She is alert.  Psychiatric:        Mood and Affect: Mood normal.     ED Results / Procedures / Treatments   Labs (all labs ordered are listed, but only abnormal results are displayed) Labs Reviewed  CBC - Abnormal; Notable for the following components:      Result Value   WBC 10.8 (*)    All other components within normal limits  BASIC METABOLIC PANEL  PREGNANCY, URINE  TROPONIN I (HIGH SENSITIVITY)  TROPONIN I (HIGH SENSITIVITY)    EKG EKG Interpretation  Date/Time:  Wednesday January 10 2022 10:29:17 EDT Ventricular Rate:  65 PR Interval:  128 QRS Duration: 80 QT Interval:  402 QTC Calculation: 418 R Axis:   88 Text Interpretation: Normal sinus rhythm Cannot rule out Anterior infarct , age undetermined no acute ST/T changes similar to Feb 2019 Confirmed by Mar 2019 [phone]) on 01/10/2022 10:56:41 AM  Radiology DG Chest 2 View  Result Date: 01/10/2022 CLINICAL DATA:  Chest pain EXAM: CHEST - 2 VIEW COMPARISON:  Radiograph 07/11/2017 FINDINGS: The heart size and mediastinal contours are within normal limits.No focal airspace disease. No pleural effusion or pneumothorax.No acute osseous abnormality. Mild thoracic spondylosis. IMPRESSION: No evidence of acute cardiopulmonary disease.  Electronically Signed   By: 09/08/2017 M.D.   On: 01/10/2022 10:52    Procedures Procedures    Medications Ordered in ED Medications - No data to display  ED Course/ Medical Decision Making/ A&P                           Medical Decision Making Amount and/or Complexity of Data Reviewed Labs: ordered. Radiology: ordered.   41 year old female presenting with chest pain, dizziness, shortness of breath over the last several days.  Admits to having stressful situations at home.  Her vitals on arrival are normal, blood pressure 122/84, afebrile,  not tachycardic, tachypneic or hypoxic.  I reviewed her vital signs from urgent care and her blood pressure there was also normal.  I suspect that her blood pressure at home may have been an erroneous reading.  Her physical exam is overall unremarkable, her lung sounds are clear, she has no lower extremity edema, she does not appear to be in any respiratory distress, I hear no wheezing, rales or crackles.  Differential diagnosis includes anxiety, GERD, dehydration, stable angina, PE, pneumonia/sepsis  I ordered, reviewed and interpreted her lab work.  CBC shows a mild leukocytosis of 10.8, likely nonspecific given no fevers, cough, urinary symptoms or any other infectious symptoms.  Her BMP is unremarkable, electrolytes are normal, creatinine is normal.  Initial troponin of less than 2.  I do not think she needs a repeat troponin given chest pain has been ongoing for several days now and she is low risk.  Pregnancy is negative.  Ordered, reviewed and interpreted imaging, agree with radiology read.  Chest x-ray shows no evidence of pneumonia, dissection, pneumothorax. No evidence of new onset HF on exam. EKG reviewed, and is nonischemic.  She PERC negative and I have low suspicion for PE.  She was ambulated without chest pain or dizziness.  Patient is to be discharged with recommendation to follow up with PCP in regards to today's hospital visit. Chest pain  is not likely of cardiac or pulmonary etiology d/t presentation, PERC negative, VSS, no tracheal deviation, no JVD or new murmur, RRR, breath sounds equal bilaterally, EKG without acute abnormalities, negative troponin, and negative CXR. She has no abdominal pain, no nausea, vomiting, or other symptoms to suggest that she has pancreatitis.  She has no Electra abnormalities and normal renal function to suggest that she is dehydrated.  Pt has been advised to return to the ED if CP becomes exertional, associated with diaphoresis or nausea, radiates to left jaw/arm, worsens or becomes concerning in any way. Pt appears reliable for follow up and is agreeable to discharge.  Admission considered however no indication at this time.  Stable for discharge.  Final Clinical Impression(s) / ED Diagnoses Final diagnoses:  Chest pain, unspecified type    Rx / DC Orders ED Discharge Orders     None              Leone Brand 01/10/22 1207    Pricilla Loveless, MD 01/11/22 [phone]

## 2022-01-10 NOTE — ED Triage Notes (Signed)
Arrived from UC; c/o low blood pressure, dizziness and shortness of breathe since yesterday; denies PMH.

## 2022-01-10 NOTE — Discharge Instructions (Addendum)
You were evaluated in the Emergency Department and after careful evaluation, we did not find any emergent condition requiring admission or further testing in the hospital.  Your workup today was overall reassuring.  Please follow-up with your primary care doctor if your symptoms persist.  Please return to the ER if your chest pain becomes exertional, you have associated sweating or nausea, radiation to the left arm or jaw,, feel like you are going to pass out or any other new or concerning symptoms.  Please return to the Emergency Department if you experience any worsening of your condition.  We encourage you to follow up with a primary care provider.  Thank you for allowing [place] to be a part of your care.
